# Patient Record
Sex: Male | Born: 1935 | Race: White | Hispanic: No | Marital: Married | Smoking: Never smoker
Health system: Southern US, Community
[De-identification: ages and names within clinical notes are randomized; demographics above are authoritative.]

## PROBLEM LIST (undated history)

## (undated) DIAGNOSIS — I10 Essential (primary) hypertension: Secondary | ICD-10-CM

## (undated) DIAGNOSIS — E785 Hyperlipidemia, unspecified: Secondary | ICD-10-CM

## (undated) DIAGNOSIS — K862 Cyst of pancreas: Secondary | ICD-10-CM

## (undated) HISTORY — PX: TONSILLECTOMY: SUR1361

## (undated) HISTORY — DX: Cyst of pancreas: K86.2

---

## 2004-06-23 ENCOUNTER — Ambulatory Visit: Payer: Self-pay

## 2014-11-02 ENCOUNTER — Other Ambulatory Visit: Payer: Self-pay | Admitting: Family Medicine

## 2014-12-09 ENCOUNTER — Other Ambulatory Visit: Payer: Self-pay | Admitting: Family Medicine

## 2015-03-04 ENCOUNTER — Encounter: Payer: Self-pay | Admitting: Emergency Medicine

## 2015-03-04 ENCOUNTER — Emergency Department
Admission: EM | Admit: 2015-03-04 | Discharge: 2015-03-04 | Disposition: A | Discharge status: Home / Self Care | Payer: Medicare Other | Attending: Emergency Medicine | Admitting: Emergency Medicine

## 2015-03-04 DIAGNOSIS — S0181XA Laceration without foreign body of other part of head, initial encounter: Secondary | ICD-10-CM | POA: Insufficient documentation

## 2015-03-04 DIAGNOSIS — Y998 Other external cause status: Secondary | ICD-10-CM | POA: Insufficient documentation

## 2015-03-04 DIAGNOSIS — Y9389 Activity, other specified: Secondary | ICD-10-CM | POA: Insufficient documentation

## 2015-03-04 DIAGNOSIS — W108XXA Fall (on) (from) other stairs and steps, initial encounter: Secondary | ICD-10-CM | POA: Diagnosis not present

## 2015-03-04 DIAGNOSIS — S60222A Contusion of left hand, initial encounter: Secondary | ICD-10-CM | POA: Insufficient documentation

## 2015-03-04 DIAGNOSIS — I10 Essential (primary) hypertension: Secondary | ICD-10-CM | POA: Diagnosis not present

## 2015-03-04 DIAGNOSIS — Y9289 Other specified places as the place of occurrence of the external cause: Secondary | ICD-10-CM | POA: Diagnosis not present

## 2015-03-04 DIAGNOSIS — S0990XA Unspecified injury of head, initial encounter: Secondary | ICD-10-CM | POA: Diagnosis present

## 2015-03-04 HISTORY — DX: Essential (primary) hypertension: I10

## 2015-03-04 HISTORY — DX: Hyperlipidemia, unspecified: E78.5

## 2015-03-04 MED ORDER — BACITRACIN ZINC 500 UNIT/GM EX OINT
TOPICAL_OINTMENT | Freq: Two times a day (BID) | CUTANEOUS | Status: DC
  Administered 2015-03-04: 1 via TOPICAL
  Filled 2015-03-04: qty 0.9

## 2015-03-04 MED ORDER — TRAMADOL HCL 50 MG PO TABS: 50.0000 mg | ORAL_TABLET | Freq: Four times a day (QID) | ORAL | Status: AC | PRN

## 2015-03-04 NOTE — Discharge Instructions (Signed)
Laceration Care, Adult  A laceration is a cut that goes through all layers of the skin. The cut also goes into the tissue that is right under the skin. Some cuts heal on their own. Others need to be closed with stitches (sutures), staples, skin adhesive strips, or wound glue. Taking care of your cut lowers your risk of infection and helps your cut to heal better.  HOW TO TAKE CARE OF YOUR CUT  For stitches or staples:  · Keep the wound clean and dry.  · If you were given a bandage (dressing), you should change it at least one time per day or as told by your doctor. You should also change it if it gets wet or dirty.  · Keep the wound completely dry for the first 24 hours or as told by your doctor. After that time, you may take a shower or a bath. However, make sure that the wound is not soaked in water until after the stitches or staples have been removed.  · Clean the wound one time each day or as told by your doctor:    Wash the wound with soap and water.    Rinse the wound with water until all of the soap comes off.    Pat the wound dry with a clean towel. Do not rub the wound.  · After you clean the wound, put a thin layer of antibiotic ointment on it as told by your doctor. This ointment:    Helps to prevent infection.    Keeps the bandage from sticking to the wound.  · Have your stitches or staples removed as told by your doctor.  If your doctor used skin adhesive strips:   · Keep the wound clean and dry.  · If you were given a bandage, you should change it at least one time per day or as told by your doctor. You should also change it if it gets dirty or wet.  · Do not get the skin adhesive strips wet. You can take a shower or a bath, but be careful to keep the wound dry.  · If the wound gets wet, pat it dry with a clean towel. Do not rub the wound.  · Skin adhesive strips fall off on their own. You can trim the strips as the wound heals. Do not remove any strips that are still stuck to the wound. They will  fall off after a while.  If your doctor used wound glue:  · Try to keep your wound dry, but you may briefly wet it in the shower or bath. Do not soak the wound in water, such as by swimming.  · After you take a shower or a bath, gently pat the wound dry with a clean towel. Do not rub the wound.  · Do not do any activities that will make you really sweaty until the skin glue has fallen off on its own.  · Do not apply liquid, cream, or ointment medicine to your wound while the skin glue is still on.  · If you were given a bandage, you should change it at least one time per day or as told by your doctor. You should also change it if it gets dirty or wet.  · If a bandage is placed over the wound, do not let the tape for the bandage touch the skin glue.  · Do not pick at the glue. The skin glue usually stays on for 5-10 days. Then, it   falls off of the skin.  General Instructions   · To help prevent scarring, make sure to cover your wound with sunscreen whenever you are outside after stitches are removed, after adhesive strips are removed, or when wound glue stays in place and the wound is healed. Make sure to wear a sunscreen of at least 30 SPF.  · Take over-the-counter and prescription medicines only as told by your doctor.  · If you were given antibiotic medicine or ointment, take or apply it as told by your doctor. Do not stop using the antibiotic even if your wound is getting better.  · Do not scratch or pick at the wound.  · Keep all follow-up visits as told by your doctor. This is important.  · Check your wound every day for signs of infection. Watch for:    Redness, swelling, or pain.    Fluid, blood, or pus.  · Raise (elevate) the injured area above the level of your heart while you are sitting or lying down, if possible.  GET HELP IF:  · You got a tetanus shot and you have any of these problems at the injection site:    Swelling.    Very bad pain.    Redness.    Bleeding.  · You have a fever.  · A wound that was  closed breaks open.  · You notice a bad smell coming from your wound or your bandage.  · You notice something coming out of the wound, such as wood or glass.  · Medicine does not help your pain.  · You have more redness, swelling, or pain at the site of your wound.  · You have fluid, blood, or pus coming from your wound.  · You notice a change in the color of your skin near your wound.  · You need to change the bandage often because fluid, blood, or pus is coming from the wound.  · You start to have a new rash.  · You start to have numbness around the wound.  GET HELP RIGHT AWAY IF:  · You have very bad swelling around the wound.  · Your pain suddenly gets worse and is very bad.  · You notice painful lumps near the wound or on skin that is anywhere on your body.  · You have a red streak going away from your wound.  · The wound is on your hand or foot and you cannot move a finger or toe like you usually can.  · The wound is on your hand or foot and you notice that your fingers or toes look pale or bluish.     This information is not intended to replace advice given to you by your health care provider. Make sure you discuss any questions you have with your health care provider.     Document Released: 06/16/2007 Document Revised: 05/14/2014 Document Reviewed: 12/24/2013  [employer]vier Interactive Patient Education ©2016 [employer]

## 2015-03-04 NOTE — ED Notes (Signed)
Pt to ed with c/o fall today,  States he tripped over a step, fell forward hitting forehead.  Pt denies loss of consciousness.  Pt with noted bleeding to forehead. Denies use of blood thinners.

## 2015-03-04 NOTE — ED Provider Notes (Signed)
[facility] Emergency Department Provider Note  ____________________________________________  Time seen: Approximately 2:00 PM  I have reviewed the triage vital signs and the nursing notes.   HISTORY  Chief Complaint Fall and Head Injury    HPI Shane Lindsey is a 80 y.o. male who presents with a head laceration. Around 12:30pm today the patient tripped on a cement step and hit a man standing in front of him then hit his head on cement. He has lacerations on his right forehead, right cheek and 3 small cuts on the left hand. The patient complains of localized aching around the lacerations. He ranks his pain a 2/10 severity. Denies radiation and associated symptoms, including nausea, vomiting, changes in vision or hearing, photophobia,  loss of consciousness, altered mental status, numbness, tingling, weakness and neck pain/stiffness.   Past Medical History  Diagnosis Date  . Hypertension   . Hyperlipemia     There are no active problems to display for this patient.   History reviewed. No pertinent past surgical history.  Current Outpatient Rx  Name  Route  Sig  Dispense  Refill  . traMADol (ULTRAM) 50 MG tablet   Oral   Take 1 tablet (50 mg total) by mouth every 6 (six) hours as needed for moderate pain.   12 tablet   0     Allergies Review of patient's allergies indicates no known allergies.  History reviewed. No pertinent family history.  Social History Social History  Substance Use Topics  . Smoking status: Never Smoker   . Smokeless tobacco: None  . Alcohol Use: Yes    Review of Systems Constitutional: No headache Eyes: No visual changes. ENT: No nose bleed. No changes in hearing. Tinnitus, unchanged.  Gastrointestinal: No nausea, no vomiting.  Musculoskeletal: Negative for neck pain. Skin: Laceration of forehead, cheek, left hand Neurological: Negative for headaches, focal weakness or numbness. 10-point ROS otherwise  negative.  ____________________________________________   PHYSICAL EXAM:  VITAL SIGNS: ED Triage Vitals  Enc Vitals Group     BP 03/04/15 1323 145/98 mmHg     Pulse Rate 03/04/15 1323 96     Resp 03/04/15 1323 20     Temp 03/04/15 1323 98.2 F (36.8 C)     Temp Source 03/04/15 1323 Oral     SpO2 03/04/15 1323 100 %     Weight 03/04/15 1323 187 lb (84.823 kg)     Height 03/04/15 1323  (1.803 m)     Head Cir --      Peak Flow --      Pain Score 03/04/15 1324 2     Pain Loc --      Pain Edu? --      Excl. in GC? --     Constitutional: Alert and oriented. Well appearing and in no acute distress. Head: 2.5 cm laceration of right forehead and 0.5 cm laceration of right cheek Cardiovascular: Normal rate, regular rhythm. Grossly normal heart sounds.   Respiratory: Normal respiratory effort.  No retractions. Lungs CTAB. Musculoskeletal: Full cervical ROM and hand ROM Neurologic: CN II-XII intact. Normal speech and language. No gross focal neurologic deficits are appreciated. No gait instability. Skin:  3 minor lacerations and ecchymosis of left hand.  Psychiatric: Mood and affect are normal. Speech and behavior are normal.  ____________________________________________   LABS (all labs ordered are listed, but only abnormal results are displayed)  Labs Reviewed - No data to display ____________________________________________  EKG   ____________________________________________  RADIOLOGY  ____________________________________________   PROCEDURES  Procedure(s) performed: None  Critical Care performed: No  _________________see procedure note LACERATION REPAIR Performed by: Randa Lynn PA-S Authorized by: Joni Reining Consent: Verbal consent obtained. Risks and benefits: risks, benefits and alternatives were discussed Consent given by: patient Patient identity confirmed: provided demographic data Prepped and Draped in normal sterile fashion Wound  explored  Laceration Location: Right forehead   Laceration Length: 2.5 cm  No Foreign Bodies seen or palpated  Anesthesia: local infiltration  Local anesthetic: lidocaine 1% with epinephrine  Anesthetic total: 3 ml  Irrigation method: syringe Amount of cleaning: standard  Skin closure: 4-0 prolene and 6-0 prolene   Number of sutures: 8  Technique: Simple interrupted   Patient tolerance: Patient tolerated the procedure well with no immediate complications. ___________________________   INITIAL IMPRESSION / ASSESSMENT AND PLAN / ED COURSE  Pertinent labs & imaging results that were available during my care of the patient were reviewed by me and considered in my medical decision making (see chart for details).  Head laceration. Patient get advised on wound care. Patient advised return back in 5 days for suture removal or sooner wound reopens. Patient given a prescription for tramadol to take as needed for pain. ____________________________________________   FINAL CLINICAL IMPRESSION(S) / ED DIAGNOSES  Final diagnoses:  Forehead laceration, initial encounter      Joni Reining, PA-C 03/04/15 1450  Richardean Canal, MD 03/05/15 1130

## 2015-03-10 ENCOUNTER — Emergency Department
Admission: EM | Admit: 2015-03-10 | Discharge: 2015-03-10 | Disposition: A | Discharge status: Home / Self Care | Payer: Medicare Other | Attending: Emergency Medicine | Admitting: Emergency Medicine

## 2015-03-10 ENCOUNTER — Encounter: Payer: Self-pay | Admitting: Emergency Medicine

## 2015-03-10 DIAGNOSIS — I10 Essential (primary) hypertension: Secondary | ICD-10-CM | POA: Insufficient documentation

## 2015-03-10 DIAGNOSIS — Z4801 Encounter for change or removal of surgical wound dressing: Secondary | ICD-10-CM | POA: Diagnosis not present

## 2015-03-10 DIAGNOSIS — Z4802 Encounter for removal of sutures: Secondary | ICD-10-CM

## 2015-03-10 NOTE — ED Notes (Signed)
Needs sutures removed from forehead.  

## 2015-03-10 NOTE — Discharge Instructions (Signed)

## 2015-03-10 NOTE — ED Provider Notes (Signed)
[facility] Emergency Department Provider Note  ____________________________________________  Time seen: Approximately 2:26 PM  I have reviewed the triage vital signs and the nursing notes.   HISTORY  Chief Complaint Suture / Staple Removal    HPI Shane Lindsey is a 80 y.o. male who presents emergency department for suture removal. Patient was seen on 03/04/2015 in this department for laceration to the forehead. 8 sutures were placed at that time. Patient returns to the emergency department for suture removal. Patient denies any complications from same. He denies any erythema or edema to area.   Past Medical History  Diagnosis Date  . Hypertension   . Hyperlipemia     There are no active problems to display for this patient.   History reviewed. No pertinent past surgical history.  Current Outpatient Rx  Name  Route  Sig  Dispense  Refill  . traMADol (ULTRAM) 50 MG tablet   Oral   Take 1 tablet (50 mg total) by mouth every 6 (six) hours as needed for moderate pain.   12 tablet   0     Allergies Review of patient's allergies indicates no known allergies.  History reviewed. No pertinent family history.  Social History Social History  Substance Use Topics  . Smoking status: Never Smoker   . Smokeless tobacco: None  . Alcohol Use: Yes     Review of Systems  Constitutional: No fever/chills Skin: Negative for rash. Positive for laceration to the right side of forehead. Neurological: Negative for headaches, focal weakness or numbness. 10-point ROS otherwise negative.  ____________________________________________   PHYSICAL EXAM:  VITAL SIGNS: ED Triage Vitals  Enc Vitals Group     BP 03/10/15 1353 141/84 mmHg     Pulse Rate 03/10/15 1353 106     Resp 03/10/15 1353 18     Temp 03/10/15 1353 97.7 F (36.5 C)     Temp src --      SpO2 03/10/15 1353 99 %     Weight 03/10/15 1353 181 lb (82.101 kg)     Height 03/10/15 1353 5'  11" (1.803 m)     Head Cir --      Peak Flow --      Pain Score 03/10/15 1352 0     Pain Loc --      Pain Edu? --      Excl. in GC? --      Constitutional: Alert and oriented. Well appearing and in no acute distress. Eyes: Conjunctivae are normal. PERRL. EOMI. Head: Atraumatic. Cardiovascular: Normal rate, regular rhythm. Normal S1 and S2.  Good peripheral circulation. Respiratory: Normal respiratory effort without tachypnea or retractions. Lungs CTAB. Neurologic:  Normal speech and language. No gross focal neurologic deficits are appreciated.  Skin:  Skin is warm, dry and intact. No rash noted. Laceration to the right forehead noted. Laceration is approximately 3 cm in length. 8 sutures are placed. No erythema or edema. No drainage noted. No dehiscence. Psychiatric: Mood and affect are normal. Speech and behavior are normal. Patient exhibits appropriate insight and judgement.   ____________________________________________   LABS (all labs ordered are listed, but only abnormal results are displayed)  Labs Reviewed - No data to display ____________________________________________  EKG   ____________________________________________  RADIOLOGY   No results found.  ____________________________________________    PROCEDURES  Procedure(s) performed:     SUTURE REMOVAL Performed by: Racheal Patches  Consent: Verbal consent obtained.  Location details: Right forehead  Wound Appearance: clean  Sutures/Staples Removed: 8   Facility: sutures placed in this facility Patient tolerance: Patient tolerated the procedure well with no immediate complications.     Medications - No data to display   ____________________________________________   INITIAL IMPRESSION / ASSESSMENT AND PLAN / ED COURSE  Pertinent labs & imaging results that were available during my care of the patient were reviewed by me and considered in my medical decision making (see chart for  details).  Patient's diagnosis is consistent with suture removal. 8 sutures are removed. No dehiscence of the wound.. Patient tolerated procedure well.     ____________________________________________  FINAL CLINICAL IMPRESSION(S) / ED DIAGNOSES  Final diagnoses:  Visit for suture removal      NEW MEDICATIONS STARTED DURING THIS VISIT:  Discharge Medication List as of 03/10/2015  2:27 PM          Delorise Royals Cuthriell, PA-C 03/10/15 1443  Sharman Cheek, MD 03/10/15 1524

## 2018-05-11 ENCOUNTER — Other Ambulatory Visit: Payer: Self-pay | Admitting: Urology

## 2018-05-11 DIAGNOSIS — R972 Elevated prostate specific antigen [PSA]: Secondary | ICD-10-CM

## 2018-05-24 ENCOUNTER — Other Ambulatory Visit: Payer: Self-pay

## 2018-05-24 ENCOUNTER — Ambulatory Visit
Admission: RE | Admit: 2018-05-24 | Discharge: 2018-05-24 | Disposition: A | Discharge status: Home / Self Care | Payer: Medicare Other | Source: Ambulatory Visit | Attending: Urology | Admitting: Urology

## 2018-05-24 DIAGNOSIS — R972 Elevated prostate specific antigen [PSA]: Secondary | ICD-10-CM | POA: Diagnosis present

## 2018-05-24 LAB — POCT I-STAT CREATININE: Creatinine, Ser: 1 mg/dL (ref 0.61–1.24)

## 2018-05-24 MED ORDER — GADOBUTROL 1 MMOL/ML IV SOLN
8.0000 mL | Freq: Once | INTRAVENOUS | Status: AC | PRN
  Administered 2018-05-24: 10:00:00 8 mL via INTRAVENOUS

## 2018-07-21 ENCOUNTER — Other Ambulatory Visit: Payer: Self-pay | Admitting: Internal Medicine

## 2018-07-21 DIAGNOSIS — R634 Abnormal weight loss: Secondary | ICD-10-CM

## 2018-07-21 DIAGNOSIS — R7989 Other specified abnormal findings of blood chemistry: Secondary | ICD-10-CM

## 2018-07-21 DIAGNOSIS — R17 Unspecified jaundice: Secondary | ICD-10-CM

## 2018-07-26 ENCOUNTER — Other Ambulatory Visit: Payer: Self-pay

## 2018-07-26 ENCOUNTER — Ambulatory Visit
Admission: RE | Admit: 2018-07-26 | Discharge: 2018-07-26 | Disposition: A | Discharge status: Home / Self Care | Payer: Medicare Other | Source: Ambulatory Visit | Attending: Internal Medicine | Admitting: Internal Medicine

## 2018-07-26 DIAGNOSIS — R17 Unspecified jaundice: Secondary | ICD-10-CM

## 2018-07-26 DIAGNOSIS — R634 Abnormal weight loss: Secondary | ICD-10-CM

## 2018-07-26 DIAGNOSIS — R7989 Other specified abnormal findings of blood chemistry: Secondary | ICD-10-CM | POA: Diagnosis present

## 2018-08-04 ENCOUNTER — Inpatient Hospital Stay: Admission: RE | Admit: 2018-08-04 | Payer: Medicare Other | Source: Ambulatory Visit

## 2018-08-08 ENCOUNTER — Ambulatory Visit: Admission: RE | Admit: 2018-08-08 | Payer: Medicare Other | Source: Home / Self Care | Admitting: Urology

## 2018-08-08 ENCOUNTER — Encounter: Admission: RE | Payer: Self-pay | Source: Home / Self Care

## 2018-08-08 SURGERY — CYSTOSCOPY, WITH BLADDER CALCULUS LITHOLAPAXY
Anesthesia: Choice

## 2018-08-10 ENCOUNTER — Emergency Department: Payer: Medicare Other

## 2018-08-10 ENCOUNTER — Other Ambulatory Visit: Payer: Self-pay

## 2018-08-10 ENCOUNTER — Emergency Department
Admission: EM | Admit: 2018-08-10 | Discharge: 2018-08-11 | Disposition: A | Discharge status: Home / Self Care | Payer: Medicare Other | Attending: Emergency Medicine | Admitting: Emergency Medicine

## 2018-08-10 DIAGNOSIS — Z79899 Other long term (current) drug therapy: Secondary | ICD-10-CM | POA: Diagnosis not present

## 2018-08-10 DIAGNOSIS — E876 Hypokalemia: Secondary | ICD-10-CM

## 2018-08-10 DIAGNOSIS — W010XXA Fall on same level from slipping, tripping and stumbling without subsequent striking against object, initial encounter: Secondary | ICD-10-CM | POA: Insufficient documentation

## 2018-08-10 DIAGNOSIS — F10920 Alcohol use, unspecified with intoxication, uncomplicated: Secondary | ICD-10-CM | POA: Insufficient documentation

## 2018-08-10 DIAGNOSIS — R4182 Altered mental status, unspecified: Secondary | ICD-10-CM | POA: Diagnosis present

## 2018-08-10 DIAGNOSIS — I10 Essential (primary) hypertension: Secondary | ICD-10-CM | POA: Diagnosis not present

## 2018-08-10 DIAGNOSIS — W19XXXA Unspecified fall, initial encounter: Secondary | ICD-10-CM

## 2018-08-10 LAB — URINALYSIS, ROUTINE W REFLEX MICROSCOPIC
Bilirubin Urine: NEGATIVE
Glucose, UA: NEGATIVE mg/dL
Hgb urine dipstick: NEGATIVE
Ketones, ur: NEGATIVE mg/dL
Leukocytes,Ua: NEGATIVE
Nitrite: NEGATIVE
Protein, ur: NEGATIVE mg/dL
Specific Gravity, Urine: 1.017 (ref 1.005–1.030)
pH: 5 (ref 5.0–8.0)

## 2018-08-10 LAB — CBC WITH DIFFERENTIAL/PLATELET
Abs Immature Granulocytes: 0.03 10*3/uL (ref 0.00–0.07)
Basophils Absolute: 0.1 10*3/uL (ref 0.0–0.1)
Basophils Relative: 1 %
Eosinophils Absolute: 0.4 10*3/uL (ref 0.0–0.5)
Eosinophils Relative: 4 %
HCT: 31.9 % — ABNORMAL LOW (ref 39.0–52.0)
Hemoglobin: 10.9 g/dL — ABNORMAL LOW (ref 13.0–17.0)
Immature Granulocytes: 0 %
Lymphocytes Relative: 37 %
Lymphs Abs: 4.1 10*3/uL — ABNORMAL HIGH (ref 0.7–4.0)
MCH: 37.3 pg — ABNORMAL HIGH (ref 26.0–34.0)
MCHC: 34.2 g/dL (ref 30.0–36.0)
MCV: 109.2 fL — ABNORMAL HIGH (ref 80.0–100.0)
Monocytes Absolute: 1.2 10*3/uL — ABNORMAL HIGH (ref 0.1–1.0)
Monocytes Relative: 11 %
Neutro Abs: 5.1 10*3/uL (ref 1.7–7.7)
Neutrophils Relative %: 47 %
Platelets: 300 10*3/uL (ref 150–400)
RBC: 2.92 MIL/uL — ABNORMAL LOW (ref 4.22–5.81)
RDW: 15.4 % (ref 11.5–15.5)
WBC: 11 10*3/uL — ABNORMAL HIGH (ref 4.0–10.5)
nRBC: 0 % (ref 0.0–0.2)

## 2018-08-10 LAB — TROPONIN I (HIGH SENSITIVITY): Troponin I (High Sensitivity): 7 ng/L (ref <18)

## 2018-08-10 LAB — COMPREHENSIVE METABOLIC PANEL
ALT: 20 U/L (ref 0–44)
AST: 52 U/L — ABNORMAL HIGH (ref 15–41)
Albumin: 2.5 g/dL — ABNORMAL LOW (ref 3.5–5.0)
Alkaline Phosphatase: 225 U/L — ABNORMAL HIGH (ref 38–126)
Anion gap: 9 (ref 5–15)
BUN: 12 mg/dL (ref 8–23)
CO2: 26 mmol/L (ref 22–32)
Calcium: 7.7 mg/dL — ABNORMAL LOW (ref 8.9–10.3)
Chloride: 101 mmol/L (ref 98–111)
Creatinine, Ser: 1.11 mg/dL (ref 0.61–1.24)
GFR calc Af Amer: >60 mL/min (ref >60)
GFR calc non Af Amer: >60 mL/min (ref >60)
Glucose, Bld: 139 mg/dL — ABNORMAL HIGH (ref 70–99)
Potassium: 2.7 mmol/L — CL (ref 3.5–5.1)
Sodium: 136 mmol/L (ref 135–145)
Total Bilirubin: 0.9 mg/dL (ref 0.3–1.2)
Total Protein: 5.7 g/dL — ABNORMAL LOW (ref 6.5–8.1)

## 2018-08-10 LAB — MAGNESIUM: Magnesium: 1.8 mg/dL (ref 1.7–2.4)

## 2018-08-10 LAB — CK: Total CK: 52 U/L (ref 49–397)

## 2018-08-10 LAB — LIPASE, BLOOD: Lipase: 16 U/L (ref 11–51)

## 2018-08-10 MED ORDER — POTASSIUM CHLORIDE CRYS ER 20 MEQ PO TBCR
40.0000 meq | EXTENDED_RELEASE_TABLET | Freq: Once | ORAL | Status: AC
  Administered 2018-08-11: 40 meq via ORAL
  Filled 2018-08-10: qty 2

## 2018-08-10 MED ORDER — POTASSIUM CHLORIDE 10 MEQ/100ML IV SOLN
10.0000 meq | INTRAVENOUS | Status: DC
  Administered 2018-08-11: 10 meq via INTRAVENOUS
  Filled 2018-08-10 (×2): qty 100

## 2018-08-10 NOTE — ED Provider Notes (Signed)
[facility] Emergency Department Provider Note  ____________________________________________   None    (approximate)  I have reviewed the triage vital signs and the nursing notes.   HISTORY  Chief Complaint Fall and Altered Mental Status    HPI Shane Lindsey is a 83 y.o. male with hypertension hyperlipidemia who presents with fall and altered mental status.  Patient presents with son.  Patient has been drinking alcohol today.  He says that he drinks daily.  He had a fall unclear exactly what time.  It was mechanical in nature in which he slipped and fell.  He lives with his wife who is not able to help him up so EMS was called.  Patient was then transported.  According the son he is been acting different over the past 3 weeks.  He is concerned that he might have cancer that he is trying to not tell the family about.  Patient himself denies losing consciousness and denies any extremity pain.  Denies any abdominal pain or chest pain.   Fall occurred one time today, it occurred due to him tripping, denies any current pain.    Past Medical History:  Diagnosis Date   Hyperlipemia    Hypertension     There are no active problems to display for this patient.   Past Surgical History:  Procedure Laterality Date   TONSILLECTOMY      Prior to Admission medications   Medication Sig Start Date End Date Taking? Authorizing Provider  lisinopril (ZESTRIL) 30 MG tablet Take 30 mg by mouth daily.   Yes [provider]  Polyethyl Glycol-Propyl Glycol (SYSTANE OP) Place 1 drop into both eyes daily as needed (dry eyes).   Yes [provider]  tamsulosin (FLOMAX) 0.4 MG CAPS capsule Take 0.4 mg by mouth daily.   Yes [provider]  traMADol (ULTRAM) 50 MG tablet Take 1 tablet (50 mg total) by mouth every 6 (six) hours as needed for moderate pain. Patient not taking: Reported on 07/31/2018 03/04/15   Joni ReiningSmith, Ronald K, PA-C     Allergies Patient has no known allergies.  No family history on file.  Social History Social History   Tobacco Use   Smoking status: Never Smoker   Smokeless tobacco: Never Used  Substance Use Topics   Alcohol use: Yes   Drug use: No      Review of Systems Constitutional: No fever/chills, positive fall Eyes: No visual changes. ENT: No sore throat. Cardiovascular: Denies chest pain. Respiratory: Denies shortness of breath. Gastrointestinal: No abdominal pain.  No nausea, no vomiting.  No diarrhea.  No constipation. Genitourinary: Negative for dysuria. Musculoskeletal: Negative for back pain. Skin: Negative for rash. Neurological: Negative for headaches, focal weakness or numbness. All other ROS negative ____________________________________________   PHYSICAL EXAM:  VITAL SIGNS: ED Triage Vitals  Enc Vitals Group     BP 08/10/18 2211 (!) 120/45     Pulse Rate 08/10/18 2211 88     Resp 08/10/18 2211 11     Temp 08/10/18 2211 98.2 F (36.8 C)     Temp src --      SpO2 08/10/18 2211 100 %     Weight 08/10/18 2212 157 lb (71.2 kg)     Height 08/10/18 2212 5\' 11"  (1.803 m)     Head Circumference --      Peak Flow --      Pain Score 08/10/18 2212 0     Pain Loc --  Pain Edu? --      Excl. in GC? --     Constitutional: Alert and oriented. GCS 15  Eyes: Conjunctivae are normal. EOMI. Head: Atraumatic. Nose: No congestion/rhinnorhea. Mouth/Throat: Mucous membranes are moist.   Neck: No stridor. Trachea Midline. FROM Cardiovascular: Normal rate, regular rhythm. Grossly normal heart sounds.  Good peripheral circulation. No chest wall tenderness Respiratory: Normal respiratory effort.  No retractions. Lungs CTAB. Gastrointestinal: Soft and nontender. No distention. No abdominal bruits.  Musculoskeletal:   RUE: No point tenderness, deformity or other signs of injury. Radial pulse intact. Neuro intact. Full ROM in joint. LUE: No point tenderness,  deformity or other signs of injury. Radial pulse intact. Neuro intact. Full ROM in joints RLE: No point tenderness, deformity or other signs of injury. DP pulse intact. Neuro intact. Full ROM in joints. LLE: No point tenderness, deformity or other signs of injury. DP pulse intact. Neuro intact. Full ROM in joints. Neurologic:  Normal speech and language. No gross focal neurologic deficits are appreciated.  Skin:  Skin is warm, dry and intact. No rash noted. Psychiatric: Mood and affect are normal.  Fast speech GU: Deferred   ____________________________________________   LABS (all labs ordered are listed, but only abnormal results are displayed)  Labs Reviewed  CBC WITH DIFFERENTIAL/PLATELET - Abnormal; Notable for the following components:      Result Value   WBC 11.0 (*)    RBC 2.92 (*)    Hemoglobin 10.9 (*)    HCT 31.9 (*)    MCV 109.2 (*)    MCH 37.3 (*)    Lymphs Abs 4.1 (*)    Monocytes Absolute 1.2 (*)    All other components within normal limits  COMPREHENSIVE METABOLIC PANEL  LIPASE, BLOOD  URINALYSIS, ROUTINE W REFLEX MICROSCOPIC  CK  MAGNESIUM  TROPONIN I (HIGH SENSITIVITY)   ____________________________________________   ED ECG REPORT I, Concha SeMary E Zackerie Sara, the attending physician, personally viewed and interpreted this ECG.  EKG is sinus rate of 77, no ST elevation, no T wave inversion, normal intervals ____________________________________________  RADIOLOGY Vela ProseI, Angeliz Settlemyre E Rodolfo Gaster, personally viewed and evaluated these images (plain radiographs) as part of my medical decision making, as well as reviewing the written report by the radiologist.  ED MD interpretation: Negative x-rays  Official radiology report(s): Dg Pelvis 1-2 Views  Result Date: 08/10/2018 CLINICAL DATA:  Fall EXAM: PELVIS - 1-2 VIEW COMPARISON:  None. FINDINGS: Pubic symphysis and rami are intact. The SI joints are non widened. Both femoral heads project in joint. No acute displaced fracture or  malalignment IMPRESSION: No acute osseous abnormality Electronically Signed   By: Jasmine PangKim  Fujinaga M.D.   On: 08/10/2018 23:26   Ct Head Wo Contrast  Result Date: 08/10/2018 CLINICAL DATA:  Fall EXAM: CT HEAD WITHOUT CONTRAST CT CERVICAL SPINE WITHOUT CONTRAST TECHNIQUE: Multidetector CT imaging of the head and cervical spine was performed following the standard protocol without intravenous contrast. Multiplanar CT image reconstructions of the cervical spine were also generated. COMPARISON:  None. FINDINGS: CT HEAD FINDINGS Brain: No acute territorial infarction, hemorrhage or intracranial mass. Moderate atrophy. Mild small vessel ischemic changes of the white matter. Slightly prominent ventricles, felt secondary to atrophy Vascular: No hyperdense vessels.  Carotid vascular calcification Skull: Normal. Negative for fracture or focal lesion. Sinuses/Orbits: Mucosal thickening in the right maxillary and ethmoid sinuses Other: None CT CERVICAL SPINE FINDINGS Alignment: Straightening of the cervical spine. Trace retrolisthesis C4 on C5. Facet alignment normal. Skull base and vertebrae: No acute  fracture. No primary bone lesion or focal pathologic process. Soft tissues and spinal canal: No prevertebral fluid or swelling. No visible canal hematoma. Disc levels:  Moderate degenerative change C5-C6 and C6-C7 Upper chest: Negative. Other: None IMPRESSION: 1. No CT evidence for acute intracranial abnormality. 2. Atrophy and small vessel ischemic changes of the white matter 3. Straightening of the cervical spine. No acute osseous abnormality. Electronically Signed   By: Donavan Foil M.D.   On: 08/10/2018 23:31   Ct Cervical Spine Wo Contrast  Result Date: 08/10/2018 CLINICAL DATA:  Fall EXAM: CT HEAD WITHOUT CONTRAST CT CERVICAL SPINE WITHOUT CONTRAST TECHNIQUE: Multidetector CT imaging of the head and cervical spine was performed following the standard protocol without intravenous contrast. Multiplanar CT image  reconstructions of the cervical spine were also generated. COMPARISON:  None. FINDINGS: CT HEAD FINDINGS Brain: No acute territorial infarction, hemorrhage or intracranial mass. Moderate atrophy. Mild small vessel ischemic changes of the white matter. Slightly prominent ventricles, felt secondary to atrophy Vascular: No hyperdense vessels.  Carotid vascular calcification Skull: Normal. Negative for fracture or focal lesion. Sinuses/Orbits: Mucosal thickening in the right maxillary and ethmoid sinuses Other: None CT CERVICAL SPINE FINDINGS Alignment: Straightening of the cervical spine. Trace retrolisthesis C4 on C5. Facet alignment normal. Skull base and vertebrae: No acute fracture. No primary bone lesion or focal pathologic process. Soft tissues and spinal canal: No prevertebral fluid or swelling. No visible canal hematoma. Disc levels:  Moderate degenerative change C5-C6 and C6-C7 Upper chest: Negative. Other: None IMPRESSION: 1. No CT evidence for acute intracranial abnormality. 2. Atrophy and small vessel ischemic changes of the white matter 3. Straightening of the cervical spine. No acute osseous abnormality. Electronically Signed   By: Donavan Foil M.D.   On: 08/10/2018 23:31   Dg Chest Portable 1 View  Result Date: 08/10/2018 CLINICAL DATA:  Fall EXAM: PORTABLE CHEST 1 VIEW COMPARISON:  None. FINDINGS: No focal airspace disease or effusion. Diffuse bilateral interstitial opacity. No pneumothorax. Borderline to mild cardiomegaly IMPRESSION: 1. Diffuse mildly coarse interstitial opacity of uncertain chronicity. Findings could be due to chronic interstitial disease although acute interstitial inflammatory process or possible atypical infection could be considered Electronically Signed   By: Donavan Foil M.D.   On: 08/10/2018 23:25    ____________________________________________   PROCEDURES  Procedure(s) performed (including Critical  Care):  Procedures   ____________________________________________   INITIAL IMPRESSION / ASSESSMENT AND PLAN / ED COURSE      Shane Lindsey was evaluated in Emergency Department on 08/10/2018 for the symptoms described in the history of present illness. He was evaluated in the context of the global COVID-19 pandemic, which necessitated consideration that the patient might be at risk for infection with the SARS-CoV-2 virus that causes COVID-19. Institutional protocols and algorithms that pertain to the evaluation of patients at risk for COVID-19 are in a state of rapid change based on information released by regulatory bodies including the CDC and federal and state organizations. These policies and algorithms were followed during the patient's care in the ED.    Patient presents with EtOH use and fall.  Will get CT head CT cervical to evaluate for epidural subdural hematoma.  Patient does not have any extremity pain to suggest extremity fracture.  Will get basic labs to evaluate for electrolyte abnormalities, CK to evaluate for rhabdo and UA to evaluate for UTI.  I extensive conversation with patient's son that if he has been diagnosed with cancer does not wish to share with  his family that is not my ability to discuss that with him due to hippa.  I explained that would have to proceed with the work-up for the event today and then go from there.    Patient handed off to incoming team pending labs and CT imaging  ____________________________________________   FINAL CLINICAL IMPRESSION(S) / ED DIAGNOSES   Final diagnoses:  Fall, initial encounter      MEDICATIONS GIVEN DURING THIS VISIT:  Medications  potassium chloride 10 mEq in 100 mL IVPB (has no administration in time range)  potassium chloride SA (K-DUR) CR tablet 40 mEq (has no administration in time range)     ED Discharge Orders    None       Note:  This document was prepared using Dragon voice recognition  software and may include unintentional dictation errors.   Concha SeFunke, Coretta Leisey E, MD 08/10/18 [phone]

## 2018-08-10 NOTE — ED Triage Notes (Signed)
PT to ED via EMS from home. Pts wife called out for a fall that happened this afternoon supposedly. PT fell from standing and hit lip on trash can. Unknown when this happened, unknown of loss of consciousness. PT has ETOH on board, unclear how much. PT is keenly alert and responsive as far as orientation questions go, but it is unclear as to why he's here.

## 2018-08-11 DIAGNOSIS — R4182 Altered mental status, unspecified: Secondary | ICD-10-CM | POA: Diagnosis not present

## 2018-08-11 LAB — URINE DRUG SCREEN, QUALITATIVE (ARMC ONLY)
Amphetamines, Ur Screen: NOT DETECTED
Barbiturates, Ur Screen: NOT DETECTED
Benzodiazepine, Ur Scrn: NOT DETECTED
Cannabinoid 50 Ng, Ur ~~LOC~~: NOT DETECTED
Cocaine Metabolite,Ur ~~LOC~~: NOT DETECTED
MDMA (Ecstasy)Ur Screen: NOT DETECTED
Methadone Scn, Ur: NOT DETECTED
Opiate, Ur Screen: NOT DETECTED
Phencyclidine (PCP) Ur S: NOT DETECTED
Tricyclic, Ur Screen: NOT DETECTED

## 2018-08-11 LAB — ETHANOL: Alcohol, Ethyl (B): 170 mg/dL — ABNORMAL HIGH (ref <10)

## 2018-08-11 MED ORDER — POTASSIUM CHLORIDE CRYS ER 20 MEQ PO TBCR
20.0000 meq | EXTENDED_RELEASE_TABLET | Freq: Once | ORAL | Status: AC
  Administered 2018-08-11: 20 meq via ORAL
  Filled 2018-08-11: qty 1

## 2018-08-11 NOTE — Discharge Instructions (Addendum)
Drink plenty of fluids daily.  Eat bananas or green leafy vegetables to supplement your potassium.  Return to the ER for worsening symptoms, persistent vomiting, lethargy, difficulty breathing or other concerns.

## 2018-08-11 NOTE — ED Provider Notes (Signed)
-----------------------------------------   2:43 AM on 08/11/2018 -----------------------------------------  Patient resting in no acute distress.  Eager for discharge home.  Voices no complaints.  No nausea/vomiting.  Will change second IV potassium to 20 mEq orally.  Patient to follow-up closely with his PCP.  Strict return precautions given.  Patient verbalizes understanding agrees with plan of care.  Son at bedside.   Paulette Blanch, MD 08/11/18 [phone]

## 2018-08-11 NOTE — ED Notes (Signed)
Previous IV started upon pt arrival infiltrated. Approx 16mL of K+ infused through infiltrated IV before realization. IV d/ced and new IV started. PT given warm compress to old IV sight with slightly more than normal redness noted.

## 2018-09-16 ENCOUNTER — Inpatient Hospital Stay
Admission: EM | Admit: 2018-09-16 | Discharge: 2018-09-19 | DRG: 683 | Disposition: A | Discharge status: Home Health | Payer: Medicare Other | Attending: Internal Medicine | Admitting: Internal Medicine

## 2018-09-16 ENCOUNTER — Observation Stay: Payer: Medicare Other

## 2018-09-16 ENCOUNTER — Emergency Department: Payer: Medicare Other

## 2018-09-16 ENCOUNTER — Encounter: Payer: Self-pay | Admitting: Emergency Medicine

## 2018-09-16 ENCOUNTER — Other Ambulatory Visit: Payer: Self-pay

## 2018-09-16 DIAGNOSIS — N179 Acute kidney failure, unspecified: Secondary | ICD-10-CM | POA: Diagnosis not present

## 2018-09-16 DIAGNOSIS — R531 Weakness: Secondary | ICD-10-CM | POA: Diagnosis not present

## 2018-09-16 DIAGNOSIS — I10 Essential (primary) hypertension: Secondary | ICD-10-CM | POA: Diagnosis present

## 2018-09-16 DIAGNOSIS — Z79899 Other long term (current) drug therapy: Secondary | ICD-10-CM

## 2018-09-16 DIAGNOSIS — Z20828 Contact with and (suspected) exposure to other viral communicable diseases: Secondary | ICD-10-CM | POA: Diagnosis present

## 2018-09-16 DIAGNOSIS — E876 Hypokalemia: Secondary | ICD-10-CM | POA: Diagnosis present

## 2018-09-16 DIAGNOSIS — R778 Other specified abnormalities of plasma proteins: Secondary | ICD-10-CM

## 2018-09-16 DIAGNOSIS — K862 Cyst of pancreas: Secondary | ICD-10-CM | POA: Diagnosis present

## 2018-09-16 DIAGNOSIS — T502X5A Adverse effect of carbonic-anhydrase inhibitors, benzothiadiazides and other diuretics, initial encounter: Secondary | ICD-10-CM | POA: Diagnosis present

## 2018-09-16 DIAGNOSIS — I110 Hypertensive heart disease with heart failure: Secondary | ICD-10-CM | POA: Diagnosis present

## 2018-09-16 DIAGNOSIS — E785 Hyperlipidemia, unspecified: Secondary | ICD-10-CM | POA: Diagnosis present

## 2018-09-16 DIAGNOSIS — I509 Heart failure, unspecified: Secondary | ICD-10-CM

## 2018-09-16 LAB — BASIC METABOLIC PANEL
Anion gap: 12 (ref 5–15)
BUN: 25 mg/dL — ABNORMAL HIGH (ref 8–23)
CO2: 27 mmol/L (ref 22–32)
Calcium: 8 mg/dL — ABNORMAL LOW (ref 8.9–10.3)
Chloride: 97 mmol/L — ABNORMAL LOW (ref 98–111)
Creatinine, Ser: 1.63 mg/dL — ABNORMAL HIGH (ref 0.61–1.24)
GFR calc Af Amer: 45 mL/min — ABNORMAL LOW (ref >60)
GFR calc non Af Amer: 39 mL/min — ABNORMAL LOW (ref >60)
Glucose, Bld: 201 mg/dL — ABNORMAL HIGH (ref 70–99)
Potassium: 3 mmol/L — ABNORMAL LOW (ref 3.5–5.1)
Sodium: 136 mmol/L (ref 135–145)

## 2018-09-16 LAB — URINALYSIS, COMPLETE (UACMP) WITH MICROSCOPIC
Bacteria, UA: NONE SEEN
Bilirubin Urine: NEGATIVE
Glucose, UA: NEGATIVE mg/dL
Hgb urine dipstick: NEGATIVE
Ketones, ur: NEGATIVE mg/dL
Leukocytes,Ua: NEGATIVE
Nitrite: NEGATIVE
Protein, ur: NEGATIVE mg/dL
Specific Gravity, Urine: 1.016 (ref 1.005–1.030)
pH: 5 (ref 5.0–8.0)

## 2018-09-16 LAB — MAGNESIUM: Magnesium: 1.6 mg/dL — ABNORMAL LOW (ref 1.7–2.4)

## 2018-09-16 LAB — CBC
HCT: 31.2 % — ABNORMAL LOW (ref 39.0–52.0)
Hemoglobin: 11.3 g/dL — ABNORMAL LOW (ref 13.0–17.0)
MCH: 38 pg — ABNORMAL HIGH (ref 26.0–34.0)
MCHC: 36.2 g/dL — ABNORMAL HIGH (ref 30.0–36.0)
MCV: 105.1 fL — ABNORMAL HIGH (ref 80.0–100.0)
Platelets: 144 10*3/uL — ABNORMAL LOW (ref 150–400)
RBC: 2.97 MIL/uL — ABNORMAL LOW (ref 4.22–5.81)
RDW: 14.1 % (ref 11.5–15.5)
WBC: 11.2 10*3/uL — ABNORMAL HIGH (ref 4.0–10.5)
nRBC: 0 % (ref 0.0–0.2)

## 2018-09-16 LAB — TROPONIN I (HIGH SENSITIVITY): Troponin I (High Sensitivity): 20 ng/L — ABNORMAL HIGH (ref <18)

## 2018-09-16 LAB — BRAIN NATRIURETIC PEPTIDE: B Natriuretic Peptide: 226 pg/mL — ABNORMAL HIGH (ref 0.0–100.0)

## 2018-09-16 MED ORDER — ONDANSETRON HCL 4 MG PO TABS: 4.0000 mg | ORAL_TABLET | Freq: Four times a day (QID) | ORAL | Status: DC | PRN

## 2018-09-16 MED ORDER — OXYCODONE HCL 5 MG PO TABS
5.0000 mg | ORAL_TABLET | Freq: Four times a day (QID) | ORAL | Status: DC | PRN
  Administered 2018-09-16 – 2018-09-19 (×3): 5 mg via ORAL
  Filled 2018-09-16 (×3): qty 1

## 2018-09-16 MED ORDER — FUROSEMIDE 40 MG PO TABS
20.0000 mg | ORAL_TABLET | Freq: Every day | ORAL | Status: DC
  Administered 2018-09-16: 23:00:00 20 mg via ORAL
  Filled 2018-09-16: qty 1

## 2018-09-16 MED ORDER — ATORVASTATIN CALCIUM 20 MG PO TABS
10.0000 mg | ORAL_TABLET | Freq: Every day | ORAL | Status: DC
  Administered 2018-09-17 – 2018-09-18 (×2): 10 mg via ORAL
  Filled 2018-09-16 (×2): qty 1

## 2018-09-16 MED ORDER — ACETAMINOPHEN 650 MG RE SUPP: 650.0000 mg | Freq: Four times a day (QID) | RECTAL | Status: DC | PRN

## 2018-09-16 MED ORDER — FUROSEMIDE 40 MG PO TABS: 20.0000 mg | ORAL_TABLET | Freq: Every day | ORAL | Status: DC

## 2018-09-16 MED ORDER — ONDANSETRON HCL 4 MG/2ML IJ SOLN: 4.0000 mg | Freq: Four times a day (QID) | INTRAMUSCULAR | Status: DC | PRN

## 2018-09-16 MED ORDER — LORAZEPAM 2 MG/ML IJ SOLN: 1.0000 mg | INTRAMUSCULAR | Status: AC

## 2018-09-16 MED ORDER — ACETAMINOPHEN 325 MG PO TABS
650.0000 mg | ORAL_TABLET | Freq: Four times a day (QID) | ORAL | Status: DC | PRN
  Administered 2018-09-17 – 2018-09-18 (×4): 650 mg via ORAL
  Filled 2018-09-16 (×4): qty 2

## 2018-09-16 MED ORDER — TAMSULOSIN HCL 0.4 MG PO CAPS
0.4000 mg | ORAL_CAPSULE | Freq: Every day | ORAL | Status: DC
  Administered 2018-09-17 – 2018-09-19 (×3): 0.4 mg via ORAL
  Filled 2018-09-16 (×3): qty 1

## 2018-09-16 MED ORDER — ENOXAPARIN SODIUM 40 MG/0.4ML ~~LOC~~ SOLN
40.0000 mg | Freq: Every day | SUBCUTANEOUS | Status: DC
  Administered 2018-09-17 – 2018-09-18 (×3): 40 mg via SUBCUTANEOUS
  Filled 2018-09-16 (×4): qty 0.4

## 2018-09-16 NOTE — ED Provider Notes (Addendum)
[facility] Emergency Department Provider Note   ____________________________________________   First MD Initiated Contact with Patient 09/16/18 1745     (approximate)  I have reviewed the triage vital signs and the nursing notes.   HISTORY  Chief Complaint Weakness    HPI Shane Lindsey is a 83 y.o. male complains of some trouble sleeping at night.  He says his legs are getting stiff and swollen.  He is also getting weak and possibly a little confusion as well.  He does not seem to have any chest pain or tightness.  He is not running a fever that I can tell.  He is not having a cough.  Son tells the nurse that he is very weak and is falling a lot.  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        Past Medical History:  Diagnosis Date  . Hyperlipemia   . Hypertension     There are no active problems to display for this patient.   Past Surgical History:  Procedure Laterality Date  . TONSILLECTOMY      Prior to Admission medications   Medication Sig Start Date End Date Taking? Authorizing Provider  lisinopril (ZESTRIL) 30 MG tablet Take 30 mg by mouth daily.    [provider]  Polyethyl Glycol-Propyl Glycol (SYSTANE OP) Place 1 drop into both eyes daily as needed (dry eyes).    [provider]  tamsulosin (FLOMAX) 0.4 MG CAPS capsule Take 0.4 mg by mouth daily.    [provider]  traMADol (ULTRAM) 50 MG tablet Take 1 tablet (50 mg total) by mouth every 6 (six) hours as needed for moderate pain. Patient not taking: Reported on 07/31/2018 03/04/15   Joni ReiningSmith, Ronald K, PA-C    Allergies Patient has no known allergies.  No family history on file.  Social History Social History   Tobacco Use  . Smoking status: Never Smoker  . Smokeless tobacco: Never Used  Substance Use Topics  .  Alcohol use: Yes  . Drug use: No    Review of Systems  Constitutional: No fever/chills Eyes: No visual changes. ENT: No sore throat. Cardiovascular: Denies chest pain. Respiratory: Denies shortness of breath. Gastrointestinal: No abdominal pain.  No nausea, no vomiting.  No diarrhea.  No constipation. Genitourinary: Negative for dysuria. Musculoskeletal: Negative for back pain. Skin: Negative for rash. Neurological: Negative for headaches, focal weakness or   ____________________________________________   PHYSICAL EXAM:  VITAL SIGNS: ED Triage Vitals  Enc Vitals Group     BP 09/16/18 1206 109/64     Pulse Rate 09/16/18 1206 97     Resp 09/16/18 1206 18     Temp 09/16/18 1206 98.8 F (37.1 C)     Temp Source 09/16/18 1206 Oral     SpO2 09/16/18 1206 99 %     Weight 09/16/18 1205 157 lb (71.2 kg)     Height 09/16/18 1205 5\' 10"  (1.778 m)     Head Circumference --      Peak Flow --      Pain Score 09/16/18 1202 0     Pain Loc --      Pain Edu? --      Excl. in GC? --     Constitutional: Alert and oriented. Well appearing and in no acute  distress. Eyes: Conjunctivae are normal.  Head: Atraumatic. Nose: No congestion/rhinnorhea. Mouth/Throat: Mucous membranes are moist.  Oropharynx non-erythematous. Neck: No stridor.   Cardiovascular: Normal rate, regular rhythm. Grossly normal heart sounds.  Good peripheral circulation. Respiratory: Normal respiratory effort.  No retractions. Lungs scattered basilar crackles Gastrointestinal: Soft and nontender. No distention. No abdominal bruits. No CVA tenderness. Musculoskeletal: No lower extremity tenderness 2+ bilateral edema edema to the knees are higher.   Neurologic:  Normal speech and language. No gross focal neurologic deficits are appreciated.  Skin:  Skin is warm, dry and intact. No rash noted.   ____________________________________________   LABS (all labs ordered are listed, but only abnormal results are displayed)   Labs Reviewed  BASIC METABOLIC PANEL - Abnormal; Notable for the following components:      Result Value   Potassium 3.0 (*)    Chloride 97 (*)    Glucose, Bld 201 (*)    BUN 25 (*)    Creatinine, Ser 1.63 (*)    Calcium 8.0 (*)    GFR calc non Af Amer 39 (*)    GFR calc Af Amer 45 (*)    All other components within normal limits  CBC - Abnormal; Notable for the following components:   WBC 11.2 (*)    RBC 2.97 (*)    Hemoglobin 11.3 (*)    HCT 31.2 (*)    MCV 105.1 (*)    MCH 38.0 (*)    MCHC 36.2 (*)    Platelets 144 (*)    All other components within normal limits  BRAIN NATRIURETIC PEPTIDE - Abnormal; Notable for the following components:   B Natriuretic Peptide 226.0 (*)    All other components within normal limits  MAGNESIUM - Abnormal; Notable for the following components:   Magnesium 1.6 (*)    All other components within normal limits  TROPONIN I (HIGH SENSITIVITY) - Abnormal; Notable for the following components:   Troponin I (High Sensitivity) 20 (*)    All other components within normal limits  URINALYSIS, COMPLETE (UACMP) WITH MICROSCOPIC   ____________________________________________  EKG  EKG read interpreted by me shows normal sinus rhythm rate of 98 left axis decreased R wave progression some flattening inferiorly of the T waves otherwise no acute changes ____________________________________________  RADIOLOGY  ED MD interpretation: CT head and neck read by radiology reviewed by me show no acute fractures there is an old fracture of the distal clavicle.  Official radiology report(s): No results found.  ____________________________________________   PROCEDURES  Procedure(s) performed (including Critical Care):  Procedures   ____________________________________________   INITIAL IMPRESSION / ASSESSMENT AND PLAN / ED COURSE Further questioning reveals patient's been having bilateral thigh pain that gets better with walking.  He reports he  has been having increasing swelling for about 2 weeks.  Apparently has had this once before he got better with compression stockings and got worse again.  He had normal renal function at the end of August. I cannot easily diurese him to get his swelling down see if he gets less tired without worsening his renal function as well.  I think we should get him in and evaluate him further and then work on improving his medical problems      MORDCHE HEDGLIN was evaluated in Emergency Department on 09/16/2018 for the symptoms described in the history of present illness. He was evaluated in the context of the global COVID-19 pandemic, which necessitated consideration that the patient might be at risk for  infection with the SARS-CoV-2 virus that causes COVID-19. Institutional protocols and algorithms that pertain to the evaluation of patients at risk for COVID-19 are in a state of rapid change based on information released by regulatory bodies including the CDC and federal and state organizations. These policies and algorithms were followed during the patient's care in the ED.       ____________________________________________   FINAL CLINICAL IMPRESSION(S) / ED DIAGNOSES  Final diagnoses:  Weakness  AKI (acute kidney injury) (HCC)  Congestive heart failure, unspecified HF chronicity, unspecified heart failure type (HCC)  Elevated troponin     ED Discharge Orders    None       Note:  This document was prepared using Dragon voice recognition software and may include unintentional dictation errors.    Arnaldo Natal, MD 09/16/18 1950    Arnaldo Natal, MD 09/16/18 2041    Arnaldo Natal, MD 09/16/18 2120

## 2018-09-16 NOTE — H&P (Addendum)
[facility] [place] [facility]   PATIENT NAME: Shane Lindsey    MR#:  [id number]  DATE OF BIRTH:  06/11/1935  DATE OF ADMISSION:  09/16/2018  PRIMARY CARE PHYSICIAN: Jaclyn Shaggyate, Denny C, MD   REQUESTING/REFERRING PHYSICIAN: Darnelle CatalanMalinda, MD  CHIEF COMPLAINT:   Chief Complaint  Patient presents with  . Weakness    HISTORY OF PRESENT ILLNESS:  Shane Lindsey  is a 83 y.o. male who presents with chief complaint as above.  Patient presents the ED with a complaint of lower extremity swelling.  He states that this is been getting progressively worse for the past 2 to 3 days, and that his legs are somewhat painful now.  On evaluation here in the ED he is found to have an elevated BNP, bilateral lower extremity edema.  He has no prior history of heart failure.  He also has some mild AKI.  Of note, patient also had an ultrasound of his abdomen back in June which showed a cystic lesion in the tail of his pancreas.  MRI was recommended by radiology, but has not been done yet.  Family inquires about possibly getting the MRI done while he is here.  Hospitalist were called for admission  PAST MEDICAL HISTORY:   Past Medical History:  Diagnosis Date  . Hyperlipemia   . Hypertension      PAST SURGICAL HISTORY:   Past Surgical History:  Procedure Laterality Date  . TONSILLECTOMY       SOCIAL HISTORY:   Social History   Tobacco Use  . Smoking status: Never Smoker  . Smokeless tobacco: Never Used  Substance Use Topics  . Alcohol use: Yes     FAMILY HISTORY:    Family history reviewed and is non-contributory DRUG ALLERGIES:  No Known Allergies  MEDICATIONS AT HOME:   Prior to Admission medications   Medication Sig Start Date End Date Taking? Authorizing Provider  traMADol (ULTRAM) 50 MG tablet Take 1 tablet (50 mg total) by mouth every 6 (six) hours as needed for moderate pain. 03/04/15  Yes Joni ReiningSmith, Ronald K, PA-C  atorvastatin (LIPITOR) 10 MG tablet Take 10  mg by mouth daily. 07/04/18   [provider]  furosemide (LASIX) 20 MG tablet Take 20 mg by mouth daily as needed for fluid. 08/22/18   [provider]  lisinopril (ZESTRIL) 30 MG tablet Take 30 mg by mouth daily.    [provider]  Polyethyl Glycol-Propyl Glycol (SYSTANE OP) Place 1 drop into both eyes daily as needed (dry eyes).    [provider]  tamsulosin (FLOMAX) 0.4 MG CAPS capsule Take 0.4 mg by mouth daily.    [provider]    REVIEW OF SYSTEMS:  Review of Systems  Constitutional: Negative for chills, fever, malaise/fatigue and weight loss.  HENT: Negative for ear pain, hearing loss and tinnitus.   Eyes: Negative for blurred vision, double vision, pain and redness.  Respiratory: Negative for cough, hemoptysis and shortness of breath.   Cardiovascular: Positive for leg swelling. Negative for chest pain, palpitations and orthopnea.  Gastrointestinal: Negative for abdominal pain, constipation, diarrhea, nausea and vomiting.  Genitourinary: Negative for dysuria, frequency and hematuria.  Musculoskeletal: Negative for back pain, joint pain and neck pain.  Skin:       No acne, rash, or lesions  Neurological: Negative for dizziness, tremors, focal weakness and weakness.  Endo/Heme/Allergies: Negative for polydipsia. Does not bruise/bleed easily.  Psychiatric/Behavioral: Negative for depression. The patient is not nervous/anxious and  does not have insomnia.      VITAL SIGNS:   Vitals:   09/16/18 1800 09/16/18 1830 09/16/18 1900 09/16/18 1930  BP: 117/71 122/71 126/69 123/77  Pulse: 74 82 79 80  Resp: 17 17 17 18   Temp:      TempSrc:      SpO2: 100% 100% 100% 99%  Weight:      Height:       Wt Readings from Last 3 Encounters:  09/16/18 71.2 kg  08/10/18 71.2 kg  03/10/15 82.1 kg    PHYSICAL EXAMINATION:  Physical Exam  Vitals reviewed. Constitutional: He is oriented to person, place, and time. He appears well-developed and  well-nourished. No distress.  HENT:  Head: Normocephalic and atraumatic.  Mouth/Throat: Oropharynx is clear and moist.  Eyes: Pupils are equal, round, and reactive to light. Conjunctivae and EOM are normal. No scleral icterus.  Neck: Normal range of motion. Neck supple. No JVD present. No thyromegaly present.  Cardiovascular: Normal rate, regular rhythm and intact distal pulses. Exam reveals no gallop and no friction rub.  Murmur heard. Respiratory: Effort normal and breath sounds normal. No respiratory distress. He has no wheezes. He has no rales.  GI: Soft. Bowel sounds are normal. He exhibits no distension. There is no abdominal tenderness.  Musculoskeletal: Normal range of motion.        General: Edema (Bilateral lower extremities) present.     Comments: No arthritis, no gout  Lymphadenopathy:    He has no cervical adenopathy.  Neurological: He is alert and oriented to person, place, and time. No cranial nerve deficit.  No dysarthria, no aphasia  Skin: Skin is warm and dry. No rash noted. No erythema.  Psychiatric: He has a normal mood and affect. His behavior is normal. Judgment and thought content normal.    LABORATORY PANEL:   CBC Recent Labs  Lab 09/16/18 1224  WBC 11.2*  HGB 11.3*  HCT 31.2*  PLT 144*   ------------------------------------------------------------------------------------------------------------------  Chemistries  Recent Labs  Lab 09/16/18 1224  NA 136  K 3.0*  CL 97*  CO2 27  GLUCOSE 201*  BUN 25*  CREATININE 1.63*  CALCIUM 8.0*  MG 1.6*   ------------------------------------------------------------------------------------------------------------------  Cardiac Enzymes No results for input(s): TROPONINI in the last 168 hours. ------------------------------------------------------------------------------------------------------------------  RADIOLOGY:  Ct Head Wo Contrast  Result Date: 09/16/2018 CLINICAL DATA:  Head trauma, found down  and difficulty walking EXAM: CT HEAD WITHOUT CONTRAST CT CERVICAL SPINE WITHOUT CONTRAST TECHNIQUE: Multidetector CT imaging of the head and cervical spine was performed following the standard protocol without intravenous contrast. Multiplanar CT image reconstructions of the cervical spine were also generated. COMPARISON:  August 10, 2018 FINDINGS: CT HEAD FINDINGS Brain: No evidence of acute territorial infarction, hemorrhage, hydrocephalus,extra-axial collection or mass lesion/mass effect. There is dilatation the ventricles and sulci consistent with age-related atrophy. Low-attenuation changes in the deep white matter consistent with small vessel ischemia. Vascular: No hyperdense vessel or unexpected calcification. Skull: The skull is intact. No fracture or focal lesion identified. Sinuses/Orbits: There is fluid seen within the right maxillary sinus. The orbits and globes intact. Other: None CT CERVICAL SPINE FINDINGS Alignment: There is slight straightening of the normal cervical lordosis. Skull base and vertebrae: Visualized skull base is intact. No atlanto-occipital dissociation. The vertebral body heights are well maintained. No fracture or pathologic osseous lesion seen. Soft tissues and spinal canal: The visualized paraspinal soft tissues are unremarkable. No prevertebral soft tissue swelling is seen. The spinal canal is grossly unremarkable,  no large epidural collection or significant canal narrowing. Disc levels: There is disc osteophyte complex and uncovertebral osteophytes most notable at C5-C6 and C6-C7 as on the recent prior exam. Upper chest: There is a partially visualized intra-articular comminuted fracture of the distal clavicle at the sternoclavicular joint. Other: Scattered vascular calcifications are seen. IMPRESSION: 1. No acute intracranial abnormality. 2. Findings consistent with age related atrophy and chronic small vessel ischemia 3.  No acute fracture or malalignment of the spine. 4.  Subacute partially visualized distal clavicle fracture at the sternoclavicular joint which was seen on prior exam August 10, 2018. These results were called by telephone at the time of interpretation on 09/16/2018 at 8:41 pm to Dr. Dorothea GlassmanPAUL MALINDA , who verbally acknowledged these results. Electronically Signed   By: Jonna ClarkBindu  Avutu M.D.   On: 09/16/2018 20:41   Ct Cervical Spine Wo Contrast  Result Date: 09/16/2018 CLINICAL DATA:  Head trauma, found down and difficulty walking EXAM: CT HEAD WITHOUT CONTRAST CT CERVICAL SPINE WITHOUT CONTRAST TECHNIQUE: Multidetector CT imaging of the head and cervical spine was performed following the standard protocol without intravenous contrast. Multiplanar CT image reconstructions of the cervical spine were also generated. COMPARISON:  August 10, 2018 FINDINGS: CT HEAD FINDINGS Brain: No evidence of acute territorial infarction, hemorrhage, hydrocephalus,extra-axial collection or mass lesion/mass effect. There is dilatation the ventricles and sulci consistent with age-related atrophy. Low-attenuation changes in the deep white matter consistent with small vessel ischemia. Vascular: No hyperdense vessel or unexpected calcification. Skull: The skull is intact. No fracture or focal lesion identified. Sinuses/Orbits: There is fluid seen within the right maxillary sinus. The orbits and globes intact. Other: None CT CERVICAL SPINE FINDINGS Alignment: There is slight straightening of the normal cervical lordosis. Skull base and vertebrae: Visualized skull base is intact. No atlanto-occipital dissociation. The vertebral body heights are well maintained. No fracture or pathologic osseous lesion seen. Soft tissues and spinal canal: The visualized paraspinal soft tissues are unremarkable. No prevertebral soft tissue swelling is seen. The spinal canal is grossly unremarkable, no large epidural collection or significant canal narrowing. Disc levels: There is disc osteophyte complex and uncovertebral  osteophytes most notable at C5-C6 and C6-C7 as on the recent prior exam. Upper chest: There is a partially visualized intra-articular comminuted fracture of the distal clavicle at the sternoclavicular joint. Other: Scattered vascular calcifications are seen. IMPRESSION: 1. No acute intracranial abnormality. 2. Findings consistent with age related atrophy and chronic small vessel ischemia 3.  No acute fracture or malalignment of the spine. 4. Subacute partially visualized distal clavicle fracture at the sternoclavicular joint which was seen on prior exam August 10, 2018. These results were called by telephone at the time of interpretation on 09/16/2018 at 8:41 pm to Dr. Dorothea GlassmanPAUL MALINDA , who verbally acknowledged these results. Electronically Signed   By: Jonna ClarkBindu  Avutu M.D.   On: 09/16/2018 20:41   Dg Chest Portable 1 View  Result Date: 09/16/2018 CLINICAL DATA:  Shortness of breath, edema EXAM: PORTABLE CHEST 1 VIEW COMPARISON:  08/10/2018 FINDINGS: Cardiomegaly. Coarsened interstitial prominence throughout the lungs is similar prior study, likely chronic interstitial lung disease. Low volumes. No confluent opacities, effusions or overt edema. No acute bony abnormality. IMPRESSION: Mild cardiomegaly. Chronic interstitial prominence likely reflects chronic interstitial lung disease. Electronically Signed   By: Charlett NoseKevin  Dover M.D.   On: 09/16/2018 20:11    EKG:   Orders placed or performed during the hospital encounter of 09/16/18  . ED EKG  . ED EKG  IMPRESSION AND PLAN:  Principal Problem:   AKI (acute kidney injury) (HCC) -patient does need some diuresis, but I suspect his AKI may be partly due to his Lasix use.  We will give him 1 dose of p.o. Lasix tonight, then hold Lasix from there.  AKI could also be partly related to exacerbation of heart failure.  See below for work-up Active Problems:   CHF (congestive heart failure) (HCC) -no prior diagnosis, unspecified chronicity, unspecified type.  Patient  needs echocardiogram.   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 Labs, imaging and/or ECG reviewed by provider and discussed with patient/family. Management plans discussed with the patient and/or family.  COVID-19 status: Pending  DVT PROPHYLAXIS: SubQ lovenox   GI PROPHYLAXIS:  None  ADMISSION STATUS: Observation    CODE STATUS: Full Advance Directive Documentation     Most Recent Value  Type of Advance Directive  Healthcare Power of Attorney, Living will  Pre-existing out of facility DNR order (yellow form or pink MOST form)  -  "MOST" Form in Place?  -      TOTAL TIME TAKING CARE OF THIS PATIENT: 40 minutes.   This patient was evaluated in the context of the global COVID-19 pandemic, which necessitated consideration that the patient might be at risk for infection with the SARS-CoV-2 virus that causes COVID-19. Institutional protocols and algorithms that pertain to the evaluation of patients at risk for COVID-19 are in a state of rapid change based on information released by regulatory bodies including the CDC and federal and state organizations. These policies and algorithms were followed to the best of this provider's knowledge to date during the patient's care at this facility.  Barney Drain 09/16/2018, 9:49 PM  [facility]  Office  [phone]  CC: Primary care physician; Jaclyn Shaggy, MD  Note:  This document was prepared using Dragon voice recognition software and may include unintentional dictation errors.

## 2018-09-16 NOTE — ED Notes (Signed)
Report called - pt is to go to [facility] but needs medicated first, then can go to his room

## 2018-09-16 NOTE — ED Notes (Signed)
Pt states he doesn't feel like he needs anything more than the oxycodone and is relaxed enough for mri. Pt taken to [facility]

## 2018-09-16 NOTE — ED Triage Notes (Addendum)
Pt arrived via POV with reports of leg stiffness and balance issues.  Pt reports being treated by Dr. Abbott Pao is poor historian. However, pt states he has not been sleeping well for several nights. Pt states he was put on some medication for the leg stiffness, but unsure of what it is called.  Pt has hx of etoh use, states he drank 2oz liquor yesterday.

## 2018-09-16 NOTE — ED Notes (Signed)
Pt meds, Lasix 20mg  as needed, Lipitor 10mg , Lisinopril 5mg  Fioricet

## 2018-09-16 NOTE — ED Notes (Addendum)
Tues or Wed morning, family found pt on the floor in the bedroom, pt had difficulty walking. Legs swelling.  Pt's son called PCP on Thursday - legs swelling from hip down.  Son states this morning, pt slid off bed and pt unable to get up with out much assistance.  Son also reports some confusion as well.

## 2018-09-16 NOTE — ED Notes (Signed)
Pt helped to stand at the end of the bed to urinate. Unsteady on his feet trying to get into position, but able to stand on his own once up. Multiple abrasions and skin tears in various stages of healing noted.

## 2018-09-17 ENCOUNTER — Observation Stay
Admit: 2018-09-17 | Discharge: 2018-09-17 | Disposition: A | Discharge status: Home / Self Care | Payer: Medicare Other | Attending: Internal Medicine | Admitting: Internal Medicine

## 2018-09-17 DIAGNOSIS — R531 Weakness: Secondary | ICD-10-CM | POA: Diagnosis present

## 2018-09-17 DIAGNOSIS — N179 Acute kidney failure, unspecified: Secondary | ICD-10-CM | POA: Diagnosis present

## 2018-09-17 DIAGNOSIS — I110 Hypertensive heart disease with heart failure: Secondary | ICD-10-CM | POA: Diagnosis present

## 2018-09-17 DIAGNOSIS — E876 Hypokalemia: Secondary | ICD-10-CM | POA: Diagnosis present

## 2018-09-17 DIAGNOSIS — Z79899 Other long term (current) drug therapy: Secondary | ICD-10-CM | POA: Diagnosis not present

## 2018-09-17 DIAGNOSIS — T502X5A Adverse effect of carbonic-anhydrase inhibitors, benzothiadiazides and other diuretics, initial encounter: Secondary | ICD-10-CM | POA: Diagnosis present

## 2018-09-17 DIAGNOSIS — E785 Hyperlipidemia, unspecified: Secondary | ICD-10-CM | POA: Diagnosis present

## 2018-09-17 DIAGNOSIS — K862 Cyst of pancreas: Secondary | ICD-10-CM | POA: Diagnosis present

## 2018-09-17 DIAGNOSIS — I509 Heart failure, unspecified: Secondary | ICD-10-CM | POA: Diagnosis present

## 2018-09-17 DIAGNOSIS — Z20828 Contact with and (suspected) exposure to other viral communicable diseases: Secondary | ICD-10-CM | POA: Diagnosis present

## 2018-09-17 LAB — BASIC METABOLIC PANEL
Anion gap: 10 (ref 5–15)
BUN: 23 mg/dL (ref 8–23)
CO2: 29 mmol/L (ref 22–32)
Calcium: 7.9 mg/dL — ABNORMAL LOW (ref 8.9–10.3)
Chloride: 98 mmol/L (ref 98–111)
Creatinine, Ser: 1.38 mg/dL — ABNORMAL HIGH (ref 0.61–1.24)
GFR calc Af Amer: 55 mL/min — ABNORMAL LOW (ref >60)
GFR calc non Af Amer: 48 mL/min — ABNORMAL LOW (ref >60)
Glucose, Bld: 122 mg/dL — ABNORMAL HIGH (ref 70–99)
Potassium: 3.5 mmol/L (ref 3.5–5.1)
Sodium: 137 mmol/L (ref 135–145)

## 2018-09-17 LAB — TROPONIN I (HIGH SENSITIVITY): Troponin I (High Sensitivity): 20 ng/L — ABNORMAL HIGH (ref <18)

## 2018-09-17 LAB — CBC
HCT: 29.9 % — ABNORMAL LOW (ref 39.0–52.0)
Hemoglobin: 10.6 g/dL — ABNORMAL LOW (ref 13.0–17.0)
MCH: 38 pg — ABNORMAL HIGH (ref 26.0–34.0)
MCHC: 35.5 g/dL (ref 30.0–36.0)
MCV: 107.2 fL — ABNORMAL HIGH (ref 80.0–100.0)
Platelets: 148 10*3/uL — ABNORMAL LOW (ref 150–400)
RBC: 2.79 MIL/uL — ABNORMAL LOW (ref 4.22–5.81)
RDW: 14.4 % (ref 11.5–15.5)
WBC: 9.1 10*3/uL (ref 4.0–10.5)
nRBC: 0 % (ref 0.0–0.2)

## 2018-09-17 LAB — SARS CORONAVIRUS 2 (TAT 6-24 HRS): SARS Coronavirus 2: NEGATIVE

## 2018-09-17 MED ORDER — GADOBUTROL 1 MMOL/ML IV SOLN
7.0000 mL | Freq: Once | INTRAVENOUS | Status: AC | PRN
  Administered 2018-09-17: 7 mL via INTRAVENOUS

## 2018-09-17 MED ORDER — FUROSEMIDE 10 MG/ML IJ SOLN
20.0000 mg | Freq: Once | INTRAMUSCULAR | Status: AC
  Administered 2018-09-17: 13:00:00 20 mg via INTRAVENOUS
  Filled 2018-09-17: qty 2

## 2018-09-17 NOTE — Care Management Obs Status (Signed)
[facility] NOTIFICATION   Patient Details  Name: Shane Lindsey MRN: [id number] Date of Birth: August 08, 1935   Medicare Observation Status Notification Given:  Yes    Braycen Burandt A Shellene Sweigert, RN 09/17/2018, 10:22 AM

## 2018-09-17 NOTE — Progress Notes (Signed)
[employer] [facility] [place] [facility]   PATIENT NAME: Shane Lindsey    MR#:  [id number]  DATE OF BIRTH:  12-03-1935  SUBJECTIVE:  CHIEF COMPLAINT:   Chief Complaint  Patient presents with  . Weakness   Came with complaint of leg edema and orthopnea with weakness. Feels slightly better already after dialysis. He lives with his wife who is more debilitated and he is primary caregiver for her at home. He said he had good sleep last night.  REVIEW OF SYSTEMS:  CONSTITUTIONAL: No fever, fatigue or weakness.  EYES: No blurred or double vision.  EARS, NOSE, AND THROAT: No tinnitus or ear pain.  RESPIRATORY: No cough, shortness of breath, wheezing or hemoptysis.  CARDIOVASCULAR: No chest pain, have orthopnea, edema.  GASTROINTESTINAL: No nausea, vomiting, diarrhea or abdominal pain.  GENITOURINARY: No dysuria, hematuria.  ENDOCRINE: No polyuria, nocturia,  HEMATOLOGY: No anemia, easy bruising or bleeding SKIN: No rash or lesion. MUSCULOSKELETAL: No joint pain or arthritis.   NEUROLOGIC: No tingling, numbness, weakness.  PSYCHIATRY: No anxiety or depression.   ROS  DRUG ALLERGIES:  No Known Allergies  VITALS:  Blood pressure 117/70, pulse 89, temperature 98.2 F (36.8 C), temperature source Oral, resp. rate 17, height 5\' 10"  (1.778 m), weight 71.2 kg, SpO2 100 %.  PHYSICAL EXAMINATION:  GENERAL:  83 y.o.-year-old patient lying in the bed with no acute distress.  EYES: Pupils equal, round, reactive to light and accommodation. No scleral icterus. Extraocular muscles intact.  HEENT: Head atraumatic, normocephalic. Oropharynx and nasopharynx clear.  NECK:  Supple, no jugular venous distention. No thyroid enlargement, no tenderness.  LUNGS: Normal breath sounds bilaterally, no wheezing, some crepitation. No use of accessory muscles of respiration.  CARDIOVASCULAR: S1, S2 normal. No murmurs, rubs, or gallops.  ABDOMEN: Soft, nontender, nondistended. Bowel sounds  present. No organomegaly or mass.  EXTREMITIES: Minimal pedal edema,no cyanosis, or clubbing.  NEUROLOGIC: Cranial nerves II through XII are intact. Muscle strength 5/5 in all extremities. Sensation intact. Gait not checked.  PSYCHIATRIC: The patient is alert and oriented x 3.  SKIN: No obvious rash, lesion, or ulcer.   Physical Exam LABORATORY PANEL:   CBC Recent Labs  Lab 09/17/18 0136  WBC 9.1  HGB 10.6*  HCT 29.9*  PLT 148*   ------------------------------------------------------------------------------------------------------------------  Chemistries  Recent Labs  Lab 09/16/18 1224 09/17/18 0136  NA 136 137  K 3.0* 3.5  CL 97* 98  CO2 27 29  GLUCOSE 201* 122*  BUN 25* 23  CREATININE 1.63* 1.38*  CALCIUM 8.0* 7.9*  MG 1.6*  --    ------------------------------------------------------------------------------------------------------------------  Cardiac Enzymes No results for input(s): TROPONINI in the last 168 hours. ------------------------------------------------------------------------------------------------------------------  RADIOLOGY:  Ct Head Wo Contrast  Result Date: 09/16/2018 CLINICAL DATA:  Head trauma, found down and difficulty walking EXAM: CT HEAD WITHOUT CONTRAST CT CERVICAL SPINE WITHOUT CONTRAST TECHNIQUE: Multidetector CT imaging of the head and cervical spine was performed following the standard protocol without intravenous contrast. Multiplanar CT image reconstructions of the cervical spine were also generated. COMPARISON:  August 10, 2018 FINDINGS: CT HEAD FINDINGS Brain: No evidence of acute territorial infarction, hemorrhage, hydrocephalus,extra-axial collection or mass lesion/mass effect. There is dilatation the ventricles and sulci consistent with age-related atrophy. Low-attenuation changes in the deep white matter consistent with small vessel ischemia. Vascular: No hyperdense vessel or unexpected calcification. Skull: The skull is intact. No  fracture or focal lesion identified. Sinuses/Orbits: There is fluid seen within the right maxillary sinus. The orbits and globes  intact. Other: None CT CERVICAL SPINE FINDINGS Alignment: There is slight straightening of the normal cervical lordosis. Skull base and vertebrae: Visualized skull base is intact. No atlanto-occipital dissociation. The vertebral body heights are well maintained. No fracture or pathologic osseous lesion seen. Soft tissues and spinal canal: The visualized paraspinal soft tissues are unremarkable. No prevertebral soft tissue swelling is seen. The spinal canal is grossly unremarkable, no large epidural collection or significant canal narrowing. Disc levels: There is disc osteophyte complex and uncovertebral osteophytes most notable at C5-C6 and C6-C7 as on the recent prior exam. Upper chest: There is a partially visualized intra-articular comminuted fracture of the distal clavicle at the sternoclavicular joint. Other: Scattered vascular calcifications are seen. IMPRESSION: 1. No acute intracranial abnormality. 2. Findings consistent with age related atrophy and chronic small vessel ischemia 3.  No acute fracture or malalignment of the spine. 4. Subacute partially visualized distal clavicle fracture at the sternoclavicular joint which was seen on prior exam August 10, 2018. These results were called by telephone at the time of interpretation on 09/16/2018 at 8:41 pm to Dr. Conni Slipper , who verbally acknowledged these results. Electronically Signed   By: Prudencio Pair M.D.   On: 09/16/2018 20:41   Ct Cervical Spine Wo Contrast  Result Date: 09/16/2018 CLINICAL DATA:  Head trauma, found down and difficulty walking EXAM: CT HEAD WITHOUT CONTRAST CT CERVICAL SPINE WITHOUT CONTRAST TECHNIQUE: Multidetector CT imaging of the head and cervical spine was performed following the standard protocol without intravenous contrast. Multiplanar CT image reconstructions of the cervical spine were also  generated. COMPARISON:  August 10, 2018 FINDINGS: CT HEAD FINDINGS Brain: No evidence of acute territorial infarction, hemorrhage, hydrocephalus,extra-axial collection or mass lesion/mass effect. There is dilatation the ventricles and sulci consistent with age-related atrophy. Low-attenuation changes in the deep white matter consistent with small vessel ischemia. Vascular: No hyperdense vessel or unexpected calcification. Skull: The skull is intact. No fracture or focal lesion identified. Sinuses/Orbits: There is fluid seen within the right maxillary sinus. The orbits and globes intact. Other: None CT CERVICAL SPINE FINDINGS Alignment: There is slight straightening of the normal cervical lordosis. Skull base and vertebrae: Visualized skull base is intact. No atlanto-occipital dissociation. The vertebral body heights are well maintained. No fracture or pathologic osseous lesion seen. Soft tissues and spinal canal: The visualized paraspinal soft tissues are unremarkable. No prevertebral soft tissue swelling is seen. The spinal canal is grossly unremarkable, no large epidural collection or significant canal narrowing. Disc levels: There is disc osteophyte complex and uncovertebral osteophytes most notable at C5-C6 and C6-C7 as on the recent prior exam. Upper chest: There is a partially visualized intra-articular comminuted fracture of the distal clavicle at the sternoclavicular joint. Other: Scattered vascular calcifications are seen. IMPRESSION: 1. No acute intracranial abnormality. 2. Findings consistent with age related atrophy and chronic small vessel ischemia 3.  No acute fracture or malalignment of the spine. 4. Subacute partially visualized distal clavicle fracture at the sternoclavicular joint which was seen on prior exam August 10, 2018. These results were called by telephone at the time of interpretation on 09/16/2018 at 8:41 pm to Dr. Conni Slipper , who verbally acknowledged these results. Electronically Signed    By: Prudencio Pair M.D.   On: 09/16/2018 20:41   Mr Abdomen W Or Wo Contrast  Result Date: 09/17/2018 CLINICAL DATA:  Cystic mass in pancreatic tail on recent ultrasound. Weight loss. EXAM: MRI ABDOMEN WITHOUT AND WITH CONTRAST TECHNIQUE: Multiplanar multisequence MR imaging of the  abdomen was performed both before and after the administration of intravenous contrast. CONTRAST:  7 mL Gadavist COMPARISON:  Ultrasound on 07/26/2018 FINDINGS: Lower chest: Tiny bilateral pleural effusions. Bibasilar T2 hyperintense interstitial prominence suspicious for mild interstitial edema. Hepatobiliary: Exam is technically suboptimal due to patient motion and nonstandard dynamic postcontrast imaging. Diffuse hepatic steatosis. No hepatic masses identified. Enlarged caudate lobe and perihepatic venous collaterals are suspicious for cirrhosis in portal venous hypertension. Mild ascites noted. Gallbladder is unremarkable. Borderline dilatation of common bile duct is seen measuring 6 mm. A meniscus sign is seen at the distal common bile duct, and a distal common bile duct stone cannot be excluded. Pancreas: Exam is technically suboptimal due to patient motion and nonstandard dynamic postcontrast imaging. The pancreas is enlarged. 4 complex lesions are seen which show at least peripheral rim enhancement, although evaluation is limited by lack of conventional precontrast and subtraction imaging. These are located in the pancreatic head measuring 2.5 x 2.2 cm, the superior aspect of the pancreatic body adjacent to the celiac axis measuring 2.5 x 1.9 cm, and the pancreatic tail measuring 4.5 x 3.4 cm and 2.4 x 2.2 cm. There appears to be thrombosis of the splenic vein. This may represent pancreatitis complicated by pseudocysts, however intraductal papillary mucinous neoplasm or other pancreatic malignancy cannot be excluded. Spleen: Within normal limits in size and appearance, although splenic vein thrombosis is noted. Adrenals/Urinary  Tract: No masses identified. No evidence of hydronephrosis. Stomach/Bowel: Visualized portion unremarkable. Vascular/Lymphatic: Question 2.5 cm celiac axis lymph node versus lesion arising from the superior pancreatic body. Other:  None. Musculoskeletal:  No suspicious bone lesions identified. IMPRESSION: Technically suboptimal exam. Enlarged pancreas with 4 complex lesions measuring up to 4.5 cm. Differential diagnosis includes pancreatitis complicated by pseudocysts, and intraductal papillary mucinous neoplasm or other pancreatic malignancy. Consider further evaluation with triple phase pancreatic protocol abdomen CT without and with contrast. Question enlarged celiac axis lymph node versus lesion arising from the superior margin of the pancreatic body. Borderline biliary ductal dilatation with meniscus sign at the ampulla, which could represent a distal common bile duct stone. Diffuse hepatic steatosis and possible cirrhosis. 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 Mild ascites. Electronically Signed   By: Danae OrleansJohn A Stahl M.D.   On: 09/17/2018 05:13   Dg Chest Portable 1 View  Result Date: 09/16/2018 CLINICAL DATA:  Shortness of breath, edema EXAM: PORTABLE CHEST 1 VIEW COMPARISON:  08/10/2018 FINDINGS: Cardiomegaly. Coarsened interstitial prominence throughout the lungs is similar prior study, likely chronic interstitial lung disease. Low volumes. No confluent opacities, effusions or overt edema. No acute bony abnormality. IMPRESSION: Mild cardiomegaly. Chronic interstitial prominence likely reflects chronic interstitial lung disease. Electronically Signed   By: Charlett NoseKevin  Dover M.D.   On: 09/16/2018 20:11    ASSESSMENT AND PLAN:   Principal Problem:   AKI (acute kidney injury) (HCC) Active Problems:   HTN (hypertension)   CHF (congestive heart failure) (HCC)   HLD (hyperlipidemia)   Pancreas cyst  * AKI (acute kidney injury) (HCC) -patient does need some diuresis      Kidney function improved  some after having diuretics yesterday.  I will give 20 mg Lasix dose again today and watch for renal function.   *Acute CHF (congestive heart failure) (HCC) -no prior diagnosis, unspecified chronicity, unspecified type.    Ordered echocardiogram.  *  Pancreas cyst -seen on ultrasound a couple of months ago, MRI abdomen shows cyst versus mass and further work-up advised. I spoke to patient and her daughter on  phone and advised him to follow-up with cancer center within the next 2 weeks to have further work-up scheduled.  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  He was supposed to follow with urologist as outpatient to have a biopsy scheduled but he did not go until now. I encouraged patient and also spoke to his daughter on phone and advised him to have this follow-up appointments done within the next few weeks.     All the records are reviewed and case discussed with Care Management/Social Workerr. Management plans discussed with the patient, family and they are in agreement.  CODE STATUS: full.  TOTAL TIME TAKING CARE OF THIS PATIENT: 35 minutes.     POSSIBLE D/C IN 1-2 DAYS, DEPENDING ON CLINICAL CONDITION.   Altamese DillingVaibhavkumar Sharonica Kraszewski M.D on 09/17/2018   Between 7am to 6pm - Pager - [phone]  After 6pm go to [email or web address] - password [email or web address]  [facility]  Office  [phone]  CC: Primary care physician; Jaclyn Shaggyate, Denny C, MD  Note: This dictation was prepared with Dragon dictation along with smaller phrase technology. Any transcriptional errors that result from this process are unintentional.

## 2018-09-17 NOTE — Progress Notes (Signed)
Family Meeting Note  Advance Directive: yes.  Today a meeting took place with the patient.  The following clinical team members were present during this meeting: MD  The following were discussed:Patient's diagnosis: CHF, prostate mass and pancreatic mass which is suspected to be malignancy but further work-up needed, Patient's progosis: Unable to determine and Goals for treatment: full code.  Additional follow-up to be provided: Oncology  Time spent during discussion:20 min.  Vaughan Basta, MD

## 2018-09-17 NOTE — Plan of Care (Signed)
  Problem: Education: Goal: Knowledge of General Education information will improve Description: Including pain rating scale, medication(s)/side effects and non-pharmacologic comfort measures Outcome: Progressing   Problem: Health Behavior/Discharge Planning: Goal: Ability to manage health-related needs will improve Outcome: Progressing   Problem: Clinical Measurements: Goal: Ability to maintain clinical measurements within normal limits will improve Outcome: Progressing Goal: Diagnostic test results will improve Outcome: Progressing   Problem: Activity: Goal: Risk for activity intolerance will decrease Outcome: Progressing   Problem: Pain Managment: Goal: General experience of comfort will improve Outcome: Progressing   Problem: Safety: Goal: Ability to remain free from injury will improve Outcome: Progressing   Problem: Skin Integrity: Goal: Risk for impaired skin integrity will decrease Outcome: Progressing   

## 2018-09-17 NOTE — TOC Transition Note (Signed)
Transition of Care [facility]) - CM/SW Discharge Note   Patient Details  Name: Shane Lindsey MRN: [id number] Date of Birth: [birth year]  [facility] CM/SW Contact:  Latanya Maudlin, RN Phone Number: 09/17/2018, 10:04 AM   Clinical Narrative:   TOC consulted as there may be home health needs. Patient lives at home with his spouse for whom he is the primary caregiver since she parkinson's. Patient has had intermittent confusion. Spoke mostly with patients spouse and children over the phone. CMS Medicare.gov Compare Post Acute Care list reviewed with patient and family. They are agreeable to home health. Referral placed with Corene Cornea at [facility] care. Patient has a rolling walker.     Final next level of care: Home w Home Health Services Barriers to Discharge: Continued Medical Work up   Patient Goals and CMS Choice   CMS Medicare.gov Compare Post Acute Care list provided to:: Patient Choice offered to / list presented to : Patient, Spouse  Discharge Placement                       Discharge Plan and Services   Discharge Planning Services: CM Consult Post Acute Care Choice: Home Health                    HH Arranged: RN, PT, Nurse's Aide [facility] Agency: [place] ([facility]) Date [place]: 09/17/18 Time [place]: 1003 Representative spoke with at [place]: [place] ([place]) Interventions     Readmission Risk Interventions No flowsheet data found.

## 2018-09-17 NOTE — Progress Notes (Signed)
*  PRELIMINARY RESULTS* Echocardiogram 2D Echocardiogram has been performed.  Shane Lindsey Cregg Jutte 09/17/2018, 12:26 PM

## 2018-09-17 NOTE — Progress Notes (Signed)
This RN spoke with patient's son (Medical POA, Lilia Pro) who would like his father to have his pancreatic and prostate masses biopsied. Son states his father- our patient- has cancelled several follow up/biopsy appointments, as well as colonoscopy appointments though his sister died r/t colon cancer, and is concerned about this happening going forward as well. This RN informed Lilia Pro (son) he would likely need to follow up at the cancer center (based on Dr. Nichola Sizer note). Son voiced understanding and agreement in this plan. He knows he can call at any time with questions.

## 2018-09-18 LAB — MAGNESIUM: Magnesium: 1.5 mg/dL — ABNORMAL LOW (ref 1.7–2.4)

## 2018-09-18 LAB — POTASSIUM
Potassium: 2.8 mmol/L — ABNORMAL LOW (ref 3.5–5.1)
Potassium: 3 mmol/L — ABNORMAL LOW (ref 3.5–5.1)

## 2018-09-18 LAB — BASIC METABOLIC PANEL
Anion gap: 10 (ref 5–15)
BUN: 22 mg/dL (ref 8–23)
CO2: 28 mmol/L (ref 22–32)
Calcium: 7.6 mg/dL — ABNORMAL LOW (ref 8.9–10.3)
Chloride: 101 mmol/L (ref 98–111)
Creatinine, Ser: 1.11 mg/dL (ref 0.61–1.24)
GFR calc Af Amer: >60 mL/min (ref >60)
GFR calc non Af Amer: >60 mL/min (ref >60)
Glucose, Bld: 125 mg/dL — ABNORMAL HIGH (ref 70–99)
Potassium: 2.3 mmol/L — CL (ref 3.5–5.1)
Sodium: 139 mmol/L (ref 135–145)

## 2018-09-18 MED ORDER — POTASSIUM CHLORIDE CRYS ER 20 MEQ PO TBCR: 40.0000 meq | EXTENDED_RELEASE_TABLET | Freq: Two times a day (BID) | ORAL | Status: DC

## 2018-09-18 MED ORDER — RAMELTEON 8 MG PO TABS
8.0000 mg | ORAL_TABLET | Freq: Every day | ORAL | Status: DC
  Administered 2018-09-18: 22:00:00 8 mg via ORAL
  Filled 2018-09-18 (×2): qty 1

## 2018-09-18 MED ORDER — MAGNESIUM SULFATE 2 GM/50ML IV SOLN
2.0000 g | Freq: Once | INTRAVENOUS | Status: AC
  Administered 2018-09-18: 2 g via INTRAVENOUS
  Filled 2018-09-18: qty 50

## 2018-09-18 MED ORDER — POTASSIUM CHLORIDE 10 MEQ/100ML IV SOLN
10.0000 meq | INTRAVENOUS | Status: DC
  Administered 2018-09-18: 09:00:00 10 meq via INTRAVENOUS
  Filled 2018-09-18: qty 100

## 2018-09-18 MED ORDER — POTASSIUM CHLORIDE 10 MEQ/100ML IV SOLN
10.0000 meq | INTRAVENOUS | Status: AC
  Administered 2018-09-18 – 2018-09-19 (×6): 10 meq via INTRAVENOUS
  Filled 2018-09-18 (×6): qty 100

## 2018-09-18 MED ORDER — POTASSIUM CHLORIDE CRYS ER 20 MEQ PO TBCR
20.0000 meq | EXTENDED_RELEASE_TABLET | Freq: Two times a day (BID) | ORAL | Status: DC
  Administered 2018-09-18: 20 meq via ORAL
  Filled 2018-09-18: qty 1

## 2018-09-18 MED ORDER — POTASSIUM CHLORIDE 10 MEQ/100ML IV SOLN
10.0000 meq | INTRAVENOUS | Status: DC
  Administered 2018-09-18: 10 meq via INTRAVENOUS

## 2018-09-18 MED ORDER — POTASSIUM CHLORIDE CRYS ER 20 MEQ PO TBCR
40.0000 meq | EXTENDED_RELEASE_TABLET | Freq: Once | ORAL | Status: AC
  Administered 2018-09-18: 17:00:00 40 meq via ORAL
  Filled 2018-09-18: qty 2

## 2018-09-18 NOTE — Progress Notes (Signed)
PHARMACY CONSULT NOTE - FOLLOW UP  Pharmacy Consult for Electrolyte Monitoring and Replacement   Recent Labs: Potassium (mmol/L)  Date Value  09/18/2018 3.0 (L)   Magnesium (mg/dL)  Date Value  09/18/2018 1.5 (L)   Calcium (mg/dL)  Date Value  09/18/2018 7.6 (L)   Albumin (g/dL)  Date Value  08/10/2018 2.5 (L)   Sodium (mmol/L)  Date Value  09/18/2018 139     Assessment:Pt is hypokalemic K =2.8 @ 1215  Mg = 1.5  Goal of Therapy:  Electrolytes WNL's  Plan:  Pt received 64meq IV x 2 @ slowed rate (pt did not tolerate) and 29meq po.  Pt received 2g IV Mag  Will recheck Mag and BMP with am labs  9/7:  K @ 1800 = 3.0 Will order KCl 10 mEq IV X 6. Will recheck electrolytes with AM labs.   Orene Desanctis ,PharmD Clinical Pharmacist 09/18/2018 8:09 PM

## 2018-09-18 NOTE — Consult Note (Signed)
PHARMACY CONSULT NOTE - FOLLOW UP  Pharmacy Consult for Electrolyte Monitoring and Replacement   Recent Labs: Potassium (mmol/L)  Date Value  09/18/2018 2.8 (L)   Magnesium (mg/dL)  Date Value  09/18/2018 1.5 (L)   Calcium (mg/dL)  Date Value  09/18/2018 7.6 (L)   Albumin (g/dL)  Date Value  08/10/2018 2.5 (L)   Sodium (mmol/L)  Date Value  09/18/2018 139    Assessment: Pt is hypokalemic K =2.8 @ 1215  Mg = 1.5  Goal of Therapy:  Electrolytes WNL's  Plan:  Pt received 84meq IV x 2 @ slowed rate (pt did not tolerate) and 75meq po.  Pt received 2g IV Mag  Will recheck Mag and BMP with am labs  Lu Duffel ,PharmD Clinical Pharmacist 09/18/2018 2:21 PM

## 2018-09-18 NOTE — Progress Notes (Signed)
[facility] [place] NAME: Shane Lindsey    MR#:  [id number]  DATE OF BIRTH:  05-20-1935  SUBJECTIVE:  CHIEF COMPLAINT:   Chief Complaint  Patient presents with  . Weakness   Came with complaint of leg edema and orthopnea with weakness. Feels slightly better already after dialysis. He lives with his wife who is more debilitated and he is primary caregiver for her at home. He said he had good sleep last night. He had some burning sensation in IV line in his arm with IV potassium today.  REVIEW OF SYSTEMS:  CONSTITUTIONAL: No fever, fatigue or weakness.  EYES: No blurred or double vision.  EARS, NOSE, AND THROAT: No tinnitus or ear pain.  RESPIRATORY: No cough, shortness of breath, wheezing or hemoptysis.  CARDIOVASCULAR: No chest pain, have orthopnea, edema.  GASTROINTESTINAL: No nausea, vomiting, diarrhea or abdominal pain.  GENITOURINARY: No dysuria, hematuria.  ENDOCRINE: No polyuria, nocturia,  HEMATOLOGY: No anemia, easy bruising or bleeding SKIN: No rash or lesion. MUSCULOSKELETAL: No joint pain or arthritis.   NEUROLOGIC: No tingling, numbness, weakness.  PSYCHIATRY: No anxiety or depression.   ROS  DRUG ALLERGIES:  No Known Allergies  VITALS:  Blood pressure 122/66, pulse 80, temperature 98.3 F (36.8 C), resp. rate 19, height 5\' 10"  (1.778 m), weight 71.2 kg, SpO2 99 %.  PHYSICAL EXAMINATION:  GENERAL:  83 y.o.-year-old patient lying in the bed with no acute distress.  EYES: Pupils equal, round, reactive to light and accommodation. No scleral icterus. Extraocular muscles intact.  HEENT: Head atraumatic, normocephalic. Oropharynx and nasopharynx clear.  NECK:  Supple, no jugular venous distention. No thyroid enlargement, no tenderness.  LUNGS: Normal breath sounds bilaterally, no wheezing, some crepitation. No use of accessory muscles of respiration.  CARDIOVASCULAR: S1, S2 normal. No murmurs, rubs, or gallops.   ABDOMEN: Soft, nontender, nondistended. Bowel sounds present. No organomegaly or mass.  EXTREMITIES: Minimal pedal edema,no cyanosis, or clubbing.  NEUROLOGIC: Cranial nerves II through XII are intact. Muscle strength 5/5 in all extremities. Sensation intact. Gait not checked.  PSYCHIATRIC: The patient is alert and oriented x 3.  SKIN: No obvious rash, lesion, or ulcer.   Physical Exam LABORATORY PANEL:   CBC Recent Labs  Lab 09/17/18 0136  WBC 9.1  HGB 10.6*  HCT 29.9*  PLT 148*   ------------------------------------------------------------------------------------------------------------------  Chemistries  Recent Labs  Lab 09/18/18 0524  NA 139  K 2.3*  CL 101  CO2 28  GLUCOSE 125*  BUN 22  CREATININE 1.11  CALCIUM 7.6*  MG 1.5*   ------------------------------------------------------------------------------------------------------------------  Cardiac Enzymes No results for input(s): TROPONINI in the last 168 hours. ------------------------------------------------------------------------------------------------------------------  RADIOLOGY:  Ct Head Wo Contrast  Result Date: 09/16/2018 CLINICAL DATA:  Head trauma, found down and difficulty walking EXAM: CT HEAD WITHOUT CONTRAST CT CERVICAL SPINE WITHOUT CONTRAST TECHNIQUE: Multidetector CT imaging of the head and cervical spine was performed following the standard protocol without intravenous contrast. Multiplanar CT image reconstructions of the cervical spine were also generated. COMPARISON:  August 10, 2018 FINDINGS: CT HEAD FINDINGS Brain: No evidence of acute territorial infarction, hemorrhage, hydrocephalus,extra-axial collection or mass lesion/mass effect. There is dilatation the ventricles and sulci consistent with age-related atrophy. Low-attenuation changes in the deep white matter consistent with small vessel ischemia. Vascular: No hyperdense vessel or unexpected calcification. Skull: The skull is intact. No  fracture or focal lesion identified. Sinuses/Orbits: There is fluid seen within the right maxillary sinus. The orbits and globes intact.  Other: None CT CERVICAL SPINE FINDINGS Alignment: There is slight straightening of the normal cervical lordosis. Skull base and vertebrae: Visualized skull base is intact. No atlanto-occipital dissociation. The vertebral body heights are well maintained. No fracture or pathologic osseous lesion seen. Soft tissues and spinal canal: The visualized paraspinal soft tissues are unremarkable. No prevertebral soft tissue swelling is seen. The spinal canal is grossly unremarkable, no large epidural collection or significant canal narrowing. Disc levels: There is disc osteophyte complex and uncovertebral osteophytes most notable at C5-C6 and C6-C7 as on the recent prior exam. Upper chest: There is a partially visualized intra-articular comminuted fracture of the distal clavicle at the sternoclavicular joint. Other: Scattered vascular calcifications are seen. IMPRESSION: 1. No acute intracranial abnormality. 2. Findings consistent with age related atrophy and chronic small vessel ischemia 3.  No acute fracture or malalignment of the spine. 4. Subacute partially visualized distal clavicle fracture at the sternoclavicular joint which was seen on prior exam August 10, 2018. These results were called by telephone at the time of interpretation on 09/16/2018 at 8:41 pm to Dr. Dorothea GlassmanPAUL MALINDA , who verbally acknowledged these results. Electronically Signed   By: Jonna ClarkBindu  Avutu M.D.   On: 09/16/2018 20:41   Ct Cervical Spine Wo Contrast  Result Date: 09/16/2018 CLINICAL DATA:  Head trauma, found down and difficulty walking EXAM: CT HEAD WITHOUT CONTRAST CT CERVICAL SPINE WITHOUT CONTRAST TECHNIQUE: Multidetector CT imaging of the head and cervical spine was performed following the standard protocol without intravenous contrast. Multiplanar CT image reconstructions of the cervical spine were also  generated. COMPARISON:  August 10, 2018 FINDINGS: CT HEAD FINDINGS Brain: No evidence of acute territorial infarction, hemorrhage, hydrocephalus,extra-axial collection or mass lesion/mass effect. There is dilatation the ventricles and sulci consistent with age-related atrophy. Low-attenuation changes in the deep white matter consistent with small vessel ischemia. Vascular: No hyperdense vessel or unexpected calcification. Skull: The skull is intact. No fracture or focal lesion identified. Sinuses/Orbits: There is fluid seen within the right maxillary sinus. The orbits and globes intact. Other: None CT CERVICAL SPINE FINDINGS Alignment: There is slight straightening of the normal cervical lordosis. Skull base and vertebrae: Visualized skull base is intact. No atlanto-occipital dissociation. The vertebral body heights are well maintained. No fracture or pathologic osseous lesion seen. Soft tissues and spinal canal: The visualized paraspinal soft tissues are unremarkable. No prevertebral soft tissue swelling is seen. The spinal canal is grossly unremarkable, no large epidural collection or significant canal narrowing. Disc levels: There is disc osteophyte complex and uncovertebral osteophytes most notable at C5-C6 and C6-C7 as on the recent prior exam. Upper chest: There is a partially visualized intra-articular comminuted fracture of the distal clavicle at the sternoclavicular joint. Other: Scattered vascular calcifications are seen. IMPRESSION: 1. No acute intracranial abnormality. 2. Findings consistent with age related atrophy and chronic small vessel ischemia 3.  No acute fracture or malalignment of the spine. 4. Subacute partially visualized distal clavicle fracture at the sternoclavicular joint which was seen on prior exam August 10, 2018. These results were called by telephone at the time of interpretation on 09/16/2018 at 8:41 pm to Dr. Dorothea GlassmanPAUL MALINDA , who verbally acknowledged these results. Electronically Signed    By: Jonna ClarkBindu  Avutu M.D.   On: 09/16/2018 20:41   Mr Abdomen W Or Wo Contrast  Result Date: 09/17/2018 CLINICAL DATA:  Cystic mass in pancreatic tail on recent ultrasound. Weight loss. EXAM: MRI ABDOMEN WITHOUT AND WITH CONTRAST TECHNIQUE: Multiplanar multisequence MR imaging of the abdomen  was performed both before and after the administration of intravenous contrast. CONTRAST:  7 mL Gadavist COMPARISON:  Ultrasound on 07/26/2018 FINDINGS: Lower chest: Tiny bilateral pleural effusions. Bibasilar T2 hyperintense interstitial prominence suspicious for mild interstitial edema. Hepatobiliary: Exam is technically suboptimal due to patient motion and nonstandard dynamic postcontrast imaging. Diffuse hepatic steatosis. No hepatic masses identified. Enlarged caudate lobe and perihepatic venous collaterals are suspicious for cirrhosis in portal venous hypertension. Mild ascites noted. Gallbladder is unremarkable. Borderline dilatation of common bile duct is seen measuring 6 mm. A meniscus sign is seen at the distal common bile duct, and a distal common bile duct stone cannot be excluded. Pancreas: Exam is technically suboptimal due to patient motion and nonstandard dynamic postcontrast imaging. The pancreas is enlarged. 4 complex lesions are seen which show at least peripheral rim enhancement, although evaluation is limited by lack of conventional precontrast and subtraction imaging. These are located in the pancreatic head measuring 2.5 x 2.2 cm, the superior aspect of the pancreatic body adjacent to the celiac axis measuring 2.5 x 1.9 cm, and the pancreatic tail measuring 4.5 x 3.4 cm and 2.4 x 2.2 cm. There appears to be thrombosis of the splenic vein. This may represent pancreatitis complicated by pseudocysts, however intraductal papillary mucinous neoplasm or other pancreatic malignancy cannot be excluded. Spleen: Within normal limits in size and appearance, although splenic vein thrombosis is noted. Adrenals/Urinary  Tract: No masses identified. No evidence of hydronephrosis. Stomach/Bowel: Visualized portion unremarkable. Vascular/Lymphatic: Question 2.5 cm celiac axis lymph node versus lesion arising from the superior pancreatic body. Other:  None. Musculoskeletal:  No suspicious bone lesions identified. IMPRESSION: Technically suboptimal exam. Enlarged pancreas with 4 complex lesions measuring up to 4.5 cm. Differential diagnosis includes pancreatitis complicated by pseudocysts, and intraductal papillary mucinous neoplasm or other pancreatic malignancy. Consider further evaluation with triple phase pancreatic protocol abdomen CT without and with contrast. Question enlarged celiac axis lymph node versus lesion arising from the superior margin of the pancreatic body. Borderline biliary ductal dilatation with meniscus sign at the ampulla, which could represent a distal common bile duct stone. Diffuse hepatic steatosis and possible cirrhosis. 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 Mild ascites. Electronically Signed   By: Danae OrleansJohn A Stahl M.D.   On: 09/17/2018 05:13   Dg Chest Portable 1 View  Result Date: 09/16/2018 CLINICAL DATA:  Shortness of breath, edema EXAM: PORTABLE CHEST 1 VIEW COMPARISON:  08/10/2018 FINDINGS: Cardiomegaly. Coarsened interstitial prominence throughout the lungs is similar prior study, likely chronic interstitial lung disease. Low volumes. No confluent opacities, effusions or overt edema. No acute bony abnormality. IMPRESSION: Mild cardiomegaly. Chronic interstitial prominence likely reflects chronic interstitial lung disease. Electronically Signed   By: Charlett NoseKevin  Dover M.D.   On: 09/16/2018 20:11    ASSESSMENT AND PLAN:   Principal Problem:   AKI (acute kidney injury) (HCC) Active Problems:   HTN (hypertension)   CHF (congestive heart failure) (HCC)   HLD (hyperlipidemia)   Pancreas cyst   Acute renal failure (ARF) (HCC)  * AKI (acute kidney injury) (HCC) -patient does need some  diuresis      Kidney function improved some after having diuretics -came to 1.1.   *Acute CHF (congestive heart failure) (HCC) -no prior diagnosis, unspecified chronicity, unspecified type.   Ordered echocardiogram.  Awaited result.  *  Pancreas cyst -seen on ultrasound a couple of months ago, MRI abdomen shows cyst versus mass and further work-up advised. I spoke to patient and her daughter on phone and advised him to  follow-up with cancer center within the next 2 weeks to have further work-up scheduled. I will contact her oncologist to make this appointment and also spoke to his daughter on phone regarding the need for this.  *Hypokalemia Due to diuresis Check magnesium. Replace potassium IV and oral also.  Recheck potassium today afternoon. Keep on telemetry monitoring until potassium is less than 3.  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  He was supposed to follow with urologist as outpatient to have a biopsy scheduled but he did not go until now. I encouraged patient and also spoke to his daughter on phone and advised him to have this follow-up appointments done within the next few weeks.     All the records are reviewed and case discussed with Care Management/Social Workerr. Management plans discussed with the patient, family and they are in agreement.  CODE STATUS: full.  TOTAL TIME TAKING CARE OF THIS PATIENT: 35 minutes.     POSSIBLE D/C IN 1-2 DAYS, DEPENDING ON CLINICAL CONDITION.   Altamese Dilling M.D on 09/18/2018   Between 7am to 6pm - Pager - [phone]  After 6pm go to [email or web address] - password [employer]  [facility]  Office  [phone]  CC: Primary care physician; Jaclyn Shaggy, MD  Note: This dictation was prepared with Dragon dictation along with smaller phrase technology. Any  transcriptional errors that result from this process are unintentional.

## 2018-09-19 LAB — ECHOCARDIOGRAM COMPLETE
Height: 70 in
Weight: 2512 oz

## 2018-09-19 LAB — BASIC METABOLIC PANEL
Anion gap: 11 (ref 5–15)
BUN: 19 mg/dL (ref 8–23)
CO2: 27 mmol/L (ref 22–32)
Calcium: 7.9 mg/dL — ABNORMAL LOW (ref 8.9–10.3)
Chloride: 100 mmol/L (ref 98–111)
Creatinine, Ser: 1.09 mg/dL (ref 0.61–1.24)
GFR calc Af Amer: >60 mL/min (ref >60)
GFR calc non Af Amer: >60 mL/min (ref >60)
Glucose, Bld: 120 mg/dL — ABNORMAL HIGH (ref 70–99)
Potassium: 3.5 mmol/L (ref 3.5–5.1)
Sodium: 138 mmol/L (ref 135–145)

## 2018-09-19 LAB — MAGNESIUM: Magnesium: 2 mg/dL (ref 1.7–2.4)

## 2018-09-19 MED ORDER — POTASSIUM CHLORIDE CRYS ER 20 MEQ PO TBCR
40.0000 meq | EXTENDED_RELEASE_TABLET | Freq: Once | ORAL | Status: AC
  Administered 2018-09-19: 40 meq via ORAL
  Filled 2018-09-19: qty 2

## 2018-09-19 MED ORDER — FUROSEMIDE 20 MG PO TABS
20.0000 mg | ORAL_TABLET | Freq: Once | ORAL | Status: AC
  Administered 2018-09-19: 08:00:00 20 mg via ORAL
  Filled 2018-09-19: qty 1

## 2018-09-19 NOTE — Evaluation (Signed)
Physical Therapy Evaluation Patient Details Name: Shane Lindsey MRN: [id number] DOB: [birth year] Today's Date: 09/19/2018   History of Present Illness  Pt is an 83 y.o. male presenting to hospital 09/16/18 with trouble sleeping, LE's stiff and swollen, and weakness.  Pt admitted with AKI, acute CHF, pancreas cyst, and prostate mass.  PMH includes htn and HLD.  Clinical Impression  Prior to hospital admission, pt reports most recently using RW for ambulation (no AD use prior to that).  Pt lives with his spouse (pt reports being caregiver for his wife but family also lives nearby and is very supportive and assist a lot).  Currently pt is CGA progressing to SBA with transfers and ambulation 250 feet with RW.  Trialed pt with no AD ambulating x10 feet but pt unsteady so used RW instead.  Pt demonstrated decreased cadence and decreased B LE step length with ambulation but overall steady without any loss of balance during session's activities using RW.  Pt reports he normally stretches his legs before getting up d/t stiffness and pt performed his own stretches (sitting in recliner) prior to standing to ambulate.  Pt would benefit from skilled PT to address noted impairments and functional limitations (see below for any additional details).  Upon hospital discharge, recommend pt discharge with HHPT.  Pt reports he has assist available for stairs to enter/exit home.    Follow Up Recommendations Home health PT;Other (comment)(assist for stairs)    Equipment Recommendations  Rolling walker with 5" wheels    Recommendations for Other Services       Precautions / Restrictions Precautions Precautions: Fall Restrictions Weight Bearing Restrictions: No      Mobility  Bed Mobility               General bed mobility comments: Deferred (pt up in chair at beginning/end of session)  Transfers Overall transfer level: Needs assistance Equipment used: Rolling walker (2 wheeled) Transfers: Sit to/from  Stand Sit to Stand: Min guard;Supervision         General transfer comment: initial vc's for UE placement to assist with standing (CGA provided) but then pt SBA next 2 trials standing from recliner during session with use of RW  Ambulation/Gait Ambulation/Gait assistance: Min guard;Supervision Gait Distance (Feet): 250 Feet Assistive device: Rolling walker (2 wheeled)   Gait velocity: steady   General Gait Details: decreased B LE step length  Stairs Stairs: Yes Stairs assistance: Min guard Stair Management: One rail Right Number of Stairs: 2 General stair comments: increased effort to step up but steady; good controlled descent down steps  Wheelchair Mobility    Modified Rankin (Stroke Patients Only)       Balance Overall balance assessment: Needs assistance Sitting-balance support: No upper extremity supported;Feet supported Sitting balance-Leahy Scale: Normal Sitting balance - Comments: steady sitting reaching outside BOS   Standing balance support: No upper extremity supported Standing balance-Leahy Scale: Good Standing balance comment: steady standing reaching within BOS                             Pertinent Vitals/Pain Pain Assessment: No/denies pain  Vitals (HR and O2 on room air) stable and WFL throughout treatment session.    Home Living Family/patient expects to be discharged to:: Private residence Living Arrangements: Spouse/significant other Available Help at Discharge: Family Type of Home: House Home Access: Stairs to enter   [employer] of Steps: 1 step with hand holds Home Layout: Two level;Able  to live on main level with bedroom/bathroom Home Equipment: Dan HumphreysWalker - 2 wheels      Prior Function Level of Independence: Independent with assistive device(s)         Comments: Ambulatory with RW most recently.  3 falls in past 6 months (1 fall last week).  Pt reports being primary caregiver for his spouse who has Parkinson's  (assists pt walking to bathroom) but has supportive family that also assists pt and pt's wife.  3 children live nearby and assist.     Hand Dominance        Extremity/Trunk Assessment   Upper Extremity Assessment Upper Extremity Assessment: Generalized weakness    Lower Extremity Assessment Lower Extremity Assessment: Generalized weakness    Cervical / Trunk Assessment Cervical / Trunk Assessment: (forward head)  Communication   Communication: No difficulties  Cognition Arousal/Alertness: Awake/alert Behavior During Therapy: WFL for tasks assessed/performed Overall Cognitive Status: Within Functional Limits for tasks assessed                                        General Comments   Nursing cleared pt for participation in physical therapy.  Pt agreeable to PT session.  Pt's son present end of session.    Exercises  Gait training with RW use (vc's for safe use)   Assessment/Plan    PT Assessment Patient needs continued PT services  PT Problem List Decreased strength;Decreased balance;Decreased mobility;Decreased knowledge of use of DME       PT Treatment Interventions DME instruction;Gait training;Stair training;Functional mobility training;Therapeutic activities;Therapeutic exercise;Balance training;Patient/family education    PT Goals (Current goals can be found in the Care Plan section)  Acute Rehab PT Goals Patient Stated Goal: to go home PT Goal Formulation: With patient Time For Goal Achievement: 10/03/18 Potential to Achieve Goals: Good    Frequency Min 2X/week   Barriers to discharge        Co-evaluation               AM-PAC PT "6 Clicks" Mobility  Outcome Measure Help needed turning from your back to your side while in a flat bed without using bedrails?: A Little Help needed moving from lying on your back to sitting on the side of a flat bed without using bedrails?: A Little Help needed moving to and from a bed to a chair  (including a wheelchair)?: A Little Help needed standing up from a chair using your arms (e.g., wheelchair or bedside chair)?: A Little Help needed to walk in hospital room?: A Little Help needed climbing 3-5 steps with a railing? : A Little 6 Click Score: 18    End of Session Equipment Utilized During Treatment: Gait belt Activity Tolerance: Patient tolerated treatment well Patient left: in chair;with call bell/phone within reach;with chair alarm set;with family/visitor present Nurse Communication: Mobility status;Precautions PT Visit Diagnosis: Unsteadiness on feet (R26.81);Muscle weakness (generalized) (M62.81);History of falling (Z91.81);Other abnormalities of gait and mobility (R26.89)    Time: [id number] PT Time Calculation (min) (ACUTE ONLY): 29 min   Charges:   PT Evaluation $PT Eval Low Complexity: 1 Low PT Treatments $Gait Training: 8-22 mins       Hendricks LimesEmily Bethene Hankinson, PT 09/19/18, 3:31 PM [phone]

## 2018-09-19 NOTE — Progress Notes (Signed)
PHARMACY CONSULT NOTE - FOLLOW UP  Pharmacy Consult for Electrolyte Monitoring and Replacement   Recent Labs: Potassium (mmol/L)  Date Value  09/19/2018 3.5   Magnesium (mg/dL)  Date Value  09/19/2018 2.0   Calcium (mg/dL)  Date Value  09/19/2018 7.9 (L)   Albumin (g/dL)  Date Value  08/10/2018 2.5 (L)   Sodium (mmol/L)  Date Value  09/19/2018 138     Assessment: Patient does not need electrolyte replacement as they are WNL.    Goal of Therapy:  Electrolytes WNL's  Plan:  No replacement at this time. Will follow electrolytes with AM labs.    Rowland Lathe ,PharmD Clinical Pharmacist 09/19/2018 11:45 AM

## 2018-09-19 NOTE — Progress Notes (Signed)
Pt helped to [facility].  No BM.  Assisted to recliner with chair alarm set, tray table in front of pt and pt given instructions to use the call light if he needed anything or to get up. Dorna Bloom RN

## 2018-09-19 NOTE — Progress Notes (Signed)
Pt woke up around 4 AM and has since been trying to get out of bed, not following commands, and uncooperative.  Pt wants to stand up.  Helped to stand with 2 assist.  Pt is unsteady, lacking judgement.  Requested pt lay back in bed.  Explained to pt that he appears to be having some confusion possibly from the sleeping pill and recommend that he lay back in bed and try to rest further. Pt assisted back in bed and alarm set to high setting. Left door open to observe pt.  Potassium run is close to being completed.  Had to run slower because of burning at IV site.  Pt did not want to use the ice pack to the IV site to help with the burning. Dorna Bloom RN

## 2018-09-19 NOTE — TOC Transition Note (Signed)
Transition of Care [facility]) - CM/SW Discharge Note   Patient Details  Name: Shane Lindsey MRN: [id number] Date of Birth: 1935/02/10  [facility] CM/SW Contact:  Shelbie Hutching, RN Phone Number: 09/19/2018, 1:35 PM   Clinical Narrative:    Patient ready for discharge today.  Home health orders are in, [facility] Fl[facility] the [facility].     Final next level of care: [place] Barriers to Discharge: Barriers Resolved   Patient Goals and CMS Choice   CMS Medicare.gov Compare Post Acute Care list provided to:: Patient Choice offered to / list presented to : Patient, Spouse  Discharge Placement                       Discharge Plan and Services   Discharge Planning Services: CM Consult Post Acute Care Choice: Home Health                    HH Arranged: RN, PT, Nurse's Aide, Social Work [employer] Agency: [place]) Date [place]: 09/19/18 Time [place]: 1335 Representative spoke with at [place]: [place] (SDOH) Interventions     Readmission Risk Interventions No flowsheet data found.

## 2018-09-19 NOTE — Discharge Summary (Signed)
[facility] [place] NAME: Shane Lindsey    MR#:  [id number]  DATE OF BIRTH:  [birth year]  DATE OF ADMISSION:  09/16/2018   ADMITTING PHYSICIAN: Lance Coon, MD  DATE OF DISCHARGE: 09/19/2018  PRIMARY CARE PHYSICIAN: Albina Billet, MD   ADMISSION DIAGNOSIS:  Weakness [R53.1] Elevated troponin [R79.89] AKI (acute kidney injury) ([place]) [N17.9] Congestive heart failure, unspecified HF chronicity, unspecified heart failure type ([place]) [I50.9] DISCHARGE DIAGNOSIS:  Principal Problem:   AKI (acute kidney injury) ([place]) Active Problems:   HTN (hypertension)   CHF (congestive heart failure) ([facility])   HLD (hyperlipidemia)   Pancreas cyst   Acute renal failure (ARF) ([place])  SECONDARY DIAGNOSIS:   Past Medical History:  Diagnosis Date  . Hyperlipemia   . Hypertension    HOSPITAL COURSE:  *AKI (acute kidney injury) ([place]) -patient does need some diuresis      Kidney function improved some after having diuretics -came to 1.1.  *AcuteCHF (congestive heart failure) ([facility]) -no prior diagnosis, unspecified chronicity, unspecified type.  Echocardiogram report is still pending.  Continue Lasix, follow-up cardiologist as outpatient.  *Pancreas cyst -seen on ultrasound a couple of months ago, MRI abdomen shows cyst versus mass and further work-up advised. He is advised him to follow-up with cancer center within the next 2 weeks to have further work-up scheduled.  Follow-up Dr. Janese Banks.  *Hypokalemia Due to diuresis.  Improved with supplement. Hypomagnesemia.  Improved with supplement.  *HTN (hypertension) -continue Lasix, hold lisinopril due to renal failure.  BP is normal.  Follow-up cardiologist to resume.  *HLD (hyperlipidemia) -continue home Lipitor.  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  He was supposed to follow with urologist as outpatient to have a biopsy scheduled but he did not go  until now.  I advised patient to follow-up with Dr. Eliberto Ivory in clinic.  DISCHARGE CONDITIONS:  Stable, discharge to home today. CONSULTS OBTAINED:   DRUG ALLERGIES:  No Known Allergies DISCHARGE MEDICATIONS:   Allergies as of 09/19/2018   No Known Allergies     Medication List    STOP taking these medications   lisinopril 30 MG tablet Commonly known as: ZESTRIL     TAKE these medications   atorvastatin 10 MG tablet Commonly known as: LIPITOR Take 10 mg by mouth daily.   furosemide 20 MG tablet Commonly known as: LASIX Take 20 mg by mouth daily.   SYSTANE OP Place 1 drop into both eyes daily as needed (dry eyes).   tamsulosin 0.4 MG Caps capsule Commonly known as: FLOMAX Take 0.4 mg by mouth daily.   traMADol 50 MG tablet Commonly known as: Ultram Take 1 tablet (50 mg total) by mouth every 6 (six) hours as needed for moderate pain.        DISCHARGE INSTRUCTIONS:  See AVS.  If you experience worsening of your admission symptoms, develop shortness of breath, life threatening emergency, suicidal or homicidal thoughts you must seek medical attention immediately by calling 911 or calling your MD immediately  if symptoms less severe.  You Must read complete instructions/literature along with all the possible adverse reactions/side effects for all the Medicines you take and that have been prescribed to you. Take any new Medicines after you have completely understood and accpet all the possible adverse reactions/side effects.   Please note  You were cared for by a hospitalist during your hospital stay. If you have any questions about  your discharge medications or the care you received while you were in the hospital after you are discharged, you can call the unit and asked to speak with the hospitalist on call if the hospitalist that took care of you is not available. Once you are discharged, your primary care physician will handle any further medical issues. Please note that  NO REFILLS for any discharge medications will be authorized once you are discharged, as it is imperative that you return to your primary care physician (or establish a relationship with a primary care physician if you do not have one) for your aftercare needs so that they can reassess your need for medications and monitor your lab values.    On the day of Discharge:  VITAL SIGNS:  Blood pressure 120/64, pulse 77, temperature 98.1 F (36.7 C), temperature source Oral, resp. rate 17, height 5\' 10"  (1.778 m), weight 71.2 kg, SpO2 100 %. PHYSICAL EXAMINATION:  GENERAL:  83 y.o.-year-old patient lying in the bed with no acute distress.  EYES: Pupils equal, round, reactive to light and accommodation. No scleral icterus. Extraocular muscles intact.  HEENT: Head atraumatic, normocephalic. Oropharynx and nasopharynx clear.  NECK:  Supple, no jugular venous distention. No thyroid enlargement, no tenderness.  LUNGS: Normal breath sounds bilaterally, no wheezing, rales,rhonchi or crepitation. No use of accessory muscles of respiration.  CARDIOVASCULAR: S1, S2 normal. No murmurs, rubs, or gallops.  ABDOMEN: Soft, non-tender, non-distended. Bowel sounds present. No organomegaly or mass.  EXTREMITIES: No pedal edema, cyanosis, or clubbing.  NEUROLOGIC: Cranial nerves II through XII are intact. Muscle strength 5/5 in all extremities. Sensation intact. Gait not checked.  PSYCHIATRIC: The patient is alert and oriented x 3.  SKIN: No obvious rash, lesion, or ulcer.  DATA REVIEW:   CBC Recent Labs  Lab 09/17/18 0136  WBC 9.1  HGB 10.6*  HCT 29.9*  PLT 148*    Chemistries  Recent Labs  Lab 09/19/18 0634  NA 138  K 3.5  CL 100  CO2 27  GLUCOSE 120*  BUN 19  CREATININE 1.09  CALCIUM 7.9*  MG 2.0     Microbiology Results  Results for orders placed or performed during the hospital encounter of 09/16/18  SARS CORONAVIRUS 2 (TAT 6-24 HRS) Nasopharyngeal Nasopharyngeal Swab     Status: None    Collection Time: 09/16/18  8:17 PM   Specimen: Nasopharyngeal Swab  Result Value Ref Range Status   SARS Coronavirus 2 NEGATIVE NEGATIVE Final    Comment: (NOTE) SARS-CoV-2 target nucleic acids are NOT DETECTED. The SARS-CoV-2 RNA is generally detectable in upper and lower respiratory specimens during the acute phase of infection. Negative results do not preclude SARS-CoV-2 infection, do not rule out co-infections with other pathogens, and should not be used as the sole basis for treatment or other patient management decisions. Negative results must be combined with clinical observations, patient history, and epidemiological information. The expected result is Negative. Fact Sheet for Patients: HairSlick.no Fact Sheet for Healthcare Providers: quierodirigir.com This test is not yet approved or cleared by the [place] FDA and  has been authorized for detection and/or diagnosis of SARS-CoV-2 by FDA under an Emergency Use Authorization (EUA). This EUA will remain  in effect (meaning this test can be used) for the duration of the COVID-19 declaration under Section 56 4(b)(1) of the Act, 21 U.S.C. section 360bbb-3(b)(1), unless the authorization is terminated or revoked sooner. Performed at [facility], [address] [place], [place] [address]  RADIOLOGY:  No results found.   Management plans discussed with the patient, family and they are in agreement.  CODE STATUS: Full Code   TOTAL TIME TAKING CARE OF THIS PATIENT: 35 minutes.    Shaune PollackQing Tkai Large M.D on 09/19/2018 at 12:18 PM  Between 7am to 6pm - Pager - [phone]  After 6pm go to [email or web address] - Social research officer, governmentpassword [email or web address] [email or web address]  [facility]  Office  [phone]  CC: Primary care physician; Jaclyn Shaggyate, Denny C, MD   Note: This dictation was prepared with Dragon dictation along with smaller phrase technology. Any transcriptional errors that  result from this process are unintentional.

## 2018-09-25 NOTE — Progress Notes (Deleted)
   Patient ID: Shane Lindsey, male    DOB: 08/13/1935, 83 y.o.   MRN: [id number]  HPI  Shane Lindsey is a 83 y/o male with a history of  Echo report from 09/17/2018 reviewed and showed an EF of 60-65%.   Admitted 09/16/2018 due to acute heart failure. Initially needed IV lasix and then transitioned to oral diuretics. Potassium supplemented due to hypokalemia. Discharged after 3 days.   He presents today for his initial visit with a chief complaint of   Review of Systems    Physical Exam    Assessment & Plan:  1: Chronic heart failure with preserved ejection fraction- - NYHA class - BNP 09/16/2018 was 226.0  2: HTN- - BP - BMP 09/19/2018 reviewed and showed sodium 138, potassium 3.5, creatinine 1.09 and GFR >60  3: Prostate mass- - to follow-up with urology

## 2018-09-26 ENCOUNTER — Other Ambulatory Visit: Payer: Self-pay

## 2018-09-26 ENCOUNTER — Ambulatory Visit: Payer: Medicare Other | Admitting: Family

## 2018-09-26 ENCOUNTER — Encounter: Payer: Self-pay | Admitting: Oncology

## 2018-09-26 ENCOUNTER — Inpatient Hospital Stay: Payer: Medicare Other

## 2018-09-26 ENCOUNTER — Inpatient Hospital Stay: Payer: Medicare Other | Attending: Oncology | Admitting: Oncology

## 2018-09-26 VITALS — BP 102/65 | HR 97 | Temp 99.0°F | Resp 16 | Wt 157.4 lb

## 2018-09-26 DIAGNOSIS — K869 Disease of pancreas, unspecified: Secondary | ICD-10-CM | POA: Diagnosis not present

## 2018-09-26 DIAGNOSIS — Z841 Family history of disorders of kidney and ureter: Secondary | ICD-10-CM | POA: Diagnosis not present

## 2018-09-26 DIAGNOSIS — K8689 Other specified diseases of pancreas: Secondary | ICD-10-CM

## 2018-09-26 DIAGNOSIS — E785 Hyperlipidemia, unspecified: Secondary | ICD-10-CM | POA: Diagnosis not present

## 2018-09-26 DIAGNOSIS — K760 Fatty (change of) liver, not elsewhere classified: Secondary | ICD-10-CM

## 2018-09-26 DIAGNOSIS — Z808 Family history of malignant neoplasm of other organs or systems: Secondary | ICD-10-CM | POA: Diagnosis not present

## 2018-09-26 DIAGNOSIS — R188 Other ascites: Secondary | ICD-10-CM

## 2018-09-26 DIAGNOSIS — Z79899 Other long term (current) drug therapy: Secondary | ICD-10-CM | POA: Diagnosis not present

## 2018-09-26 DIAGNOSIS — D539 Nutritional anemia, unspecified: Secondary | ICD-10-CM

## 2018-09-26 DIAGNOSIS — R918 Other nonspecific abnormal finding of lung field: Secondary | ICD-10-CM | POA: Diagnosis not present

## 2018-09-26 DIAGNOSIS — K8050 Calculus of bile duct without cholangitis or cholecystitis without obstruction: Secondary | ICD-10-CM

## 2018-09-26 DIAGNOSIS — R5383 Other fatigue: Secondary | ICD-10-CM

## 2018-09-26 DIAGNOSIS — R634 Abnormal weight loss: Secondary | ICD-10-CM | POA: Diagnosis not present

## 2018-09-26 DIAGNOSIS — R0602 Shortness of breath: Secondary | ICD-10-CM | POA: Diagnosis not present

## 2018-09-26 DIAGNOSIS — K862 Cyst of pancreas: Secondary | ICD-10-CM | POA: Diagnosis not present

## 2018-09-26 DIAGNOSIS — N179 Acute kidney failure, unspecified: Secondary | ICD-10-CM

## 2018-09-26 DIAGNOSIS — R531 Weakness: Secondary | ICD-10-CM | POA: Diagnosis not present

## 2018-09-26 DIAGNOSIS — I110 Hypertensive heart disease with heart failure: Secondary | ICD-10-CM

## 2018-09-26 DIAGNOSIS — I82890 Acute embolism and thrombosis of other specified veins: Secondary | ICD-10-CM | POA: Diagnosis not present

## 2018-09-26 DIAGNOSIS — M2578 Osteophyte, vertebrae: Secondary | ICD-10-CM | POA: Diagnosis not present

## 2018-09-26 DIAGNOSIS — Z809 Family history of malignant neoplasm, unspecified: Secondary | ICD-10-CM

## 2018-09-26 NOTE — Progress Notes (Signed)
Pt new today with pancreatic cysts. Pt has pain in thighs, and calves only when standing or walking. He has no appetite and per the pt and the daughters they feel that he has lost about 50 lbs since April. 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 The daughters, son, in laws has been checking on family and bringing meals for the pt and wife to eat. Pt states that he has fallen several times.  Pt. States that his dad died of cancer- he thought it was lung cancer and daughter thought it was lymphoma. Has a sister that had brain mets but neither could think what primary was. Brother with kidney disease -on dialysis

## 2018-09-27 LAB — CANCER ANTIGEN 19-9: CA 19-9: 57 U/mL — ABNORMAL HIGH (ref 0–35)

## 2018-09-28 ENCOUNTER — Encounter: Payer: Self-pay | Admitting: Oncology

## 2018-09-28 NOTE — Progress Notes (Signed)
Hematology/Oncology Consult note [facility] [phone] [phone]  Patient Care Team: Albina Billet, MD as PCP - General (Internal Medicine) Clent Jacks, RN as Oncology Nurse Navigator   Name of the patient: Shane Lindsey  [id number]  [birth year]    Reason for referral- abnormal CT abdomen   Referring physician- Dr. Bridgett Larsson  Date of visit: 09/28/18   History of presenting illness-patient is a 83 year old male with a past medical history significant for hypertension hyperlipidemia who was recently admitted the hospital For generalized weakness and acute kidney injury.  He received IV diuretics and was discharged home eventually.  He was thought to possibly have heart failure although his echocardiogram was normal.  He is yet to see a cardiologist.  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  MRI showed 4 complex lesions in the pancreas involving the head body as well as pancreatic tail.  He has been referred to [place] for further management.  Patient has also been noted to have abnormalProstate enhancement based on his MRI prostate in May 2020 and follows up with Dr. Eliberto Ivory for the same.  Patient is here with his 2 daughters.  He has been the caregiver for his wife who has Parkinson's all these years.  However patient himself has been declining since March 2020.  Prior to that he was independent of his ADLs and IADLs as well as driving.  However since March she has slowed down and has been requiring increasing assistance.  His appetite is poor and he has lost about 50 pounds of weight in the last 6 months.  Denies any abdominal pain.  Denies any shortness of breath  ECOG PS- 2  Pain scale- 0   Review of systems- Review of Systems  Constitutional: Positive for malaise/fatigue and weight loss. Negative for chills and fever.  HENT: Negative  for congestion, ear discharge and nosebleeds.   Eyes: Negative for blurred vision.  Respiratory: Negative for cough, hemoptysis, sputum production, shortness of breath and wheezing.   Cardiovascular: Negative for chest pain, palpitations, orthopnea and claudication.  Gastrointestinal: Negative for abdominal pain, blood in stool, constipation, diarrhea, heartburn, melena, nausea and vomiting.  Genitourinary: Negative for dysuria, flank pain, frequency, hematuria and urgency.  Musculoskeletal: Negative for back pain, joint pain and myalgias.  Skin: Negative for rash.  Neurological: Negative for dizziness, tingling, focal weakness, seizures, weakness and headaches.  Endo/Heme/Allergies: Does not bruise/bleed easily.  Psychiatric/Behavioral: Negative for depression and suicidal ideas. The patient does not have insomnia.     No Known Allergies  Patient Active Problem List   Diagnosis Date Noted   Acute renal failure (ARF) ([place]) 09/17/2018   HTN (hypertension) 09/16/2018   AKI (acute kidney injury) ([place]) 09/16/2018   CHF (congestive heart failure) ([place]) 09/16/2018   HLD (hyperlipidemia) 09/16/2018   Pancreas cyst 09/16/2018     Past Medical History:  Diagnosis Date   Hyperlipemia    Hypertension    Pancreatic cyst      Past Surgical History:  Procedure Laterality Date   TONSILLECTOMY      Social History   Socioeconomic History   Marital status: Married    Spouse name: Not on file   Number of children: Not on file   Years of education: Not on file   Highest education level: Not on file  Occupational History   Not on file  Social Network [employer]   Financial resource strain: Not hard at all   Food insecurity    Worry: Never true    Inability: Never true   Transportation needs    Medical: No    Non-medical: No  Tobacco Use   Smoking status: Never Smoker   Smokeless tobacco: Never Used  Substance and Sexual Activity   Alcohol use: Yes    Comment: Rum 2 oz  a day   Drug use: No   Sexual activity: Not Currently  Lifestyle   Physical activity    Days per week: 0 days    Minutes per session: 0 min   Stress: Not at all  Relationships   Social connections    Talks on phone: Once a week    Gets together: Once a week    Attends religious service: Never    Active member of club or organization: No    Attends meetings of clubs or organizations: Never    Relationship status: Married   Intimate partner violence    Fear of current or ex partner: No    Emotionally abused: No    Physically abused: No    Forced sexual activity: No  Other Topics Concern   Not on file  Social History Narrative   Lives with wife, walks with walker.     Family History  Problem Relation Age of Onset   Cancer Father    Brain cancer Sister    Kidney failure Brother      Current Outpatient Medications:    atorvastatin (LIPITOR) 10 MG tablet, Take 10 mg by mouth daily., Disp: , Rfl:    furosemide (LASIX) 20 MG tablet, Take 20 mg by mouth daily. , Disp: , Rfl:    Polyethyl Glycol-Propyl Glycol (SYSTANE OP), Place 1 drop into both eyes daily as needed (dry eyes)., Disp: , Rfl:    tamsulosin (FLOMAX) 0.4 MG CAPS capsule, Take 0.4 mg by mouth daily., Disp: , Rfl:    traMADol (ULTRAM) 50 MG tablet, Take 1 tablet (50 mg total) by mouth every 6 (six) hours as needed for moderate pain., Disp: 12 tablet, Rfl: 0   Physical exam:  Vitals:   09/26/18 1535  BP: 102/65  Pulse: 97  Resp: 16  Temp: 99 F (37.2 C)  TempSrc: Tympanic  Weight: 157 lb 6.4 oz (71.4 kg)   Physical Exam Constitutional:      Comments: Elderly frail gentleman sitting ina  Wheelchair. Appears fatigued  HENT:     Head: Normocephalic and atraumatic.  Eyes:     Pupils: Pupils are equal, round, and reactive to light.  Neck:     Musculoskeletal: Normal range of motion.  Cardiovascular:     Rate and Rhythm: Normal rate and regular rhythm.     Heart sounds: Normal heart sounds.    Pulmonary:     Effort: Pulmonary effort is normal.     Breath sounds: Normal breath sounds.  Abdominal:     General: Bowel sounds are normal.     Palpations: Abdomen is soft.  Musculoskeletal:     Comments: Trace b/l edema  Skin:    General: Skin is warm and dry.  Neurological:     Mental Status: He is alert and oriented to person, place, and time.        CMP Latest Ref Rng & Units 09/19/2018  Glucose 70 - 99 mg/dL 960(A120(H)  BUN 8 - 23 mg/dL 19  Creatinine 5.400.61 - 9.811.24 mg/dL 1.911.09  Sodium 478135 -  145 mmol/L 138  Potassium 3.5 - 5.1 mmol/L 3.5  Chloride 98 - 111 mmol/L 100  CO2 22 - 32 mmol/L 27  Calcium 8.9 - 10.3 mg/dL 7.9(L)  Total Protein 6.5 - 8.1 g/dL -  Total Bilirubin 0.3 - 1.2 mg/dL -  Alkaline Phos 38 - 161126 U/L -  AST 15 - 41 U/L -  ALT 0 - 44 U/L -   CBC Latest Ref Rng & Units 09/17/2018  WBC 4.0 - 10.5 K/uL 9.1  Hemoglobin 13.0 - 17.0 g/dL 10.6(L)  Hematocrit 39.0 - 52.0 % 29.9(L)  Platelets 150 - 400 K/uL 148(L)    No images are attached to the encounter.  Ct Head Wo Contrast  Result Date: 09/16/2018 CLINICAL DATA:  Head trauma, found down and difficulty walking EXAM: CT HEAD WITHOUT CONTRAST CT CERVICAL SPINE WITHOUT CONTRAST TECHNIQUE: Multidetector CT imaging of the head and cervical spine was performed following the standard protocol without intravenous contrast. Multiplanar CT image reconstructions of the cervical spine were also generated. COMPARISON:  August 10, 2018 FINDINGS: CT HEAD FINDINGS Brain: No evidence of acute territorial infarction, hemorrhage, hydrocephalus,extra-axial collection or mass lesion/mass effect. There is dilatation the ventricles and sulci consistent with age-related atrophy. Low-attenuation changes in the deep white matter consistent with small vessel ischemia. Vascular: No hyperdense vessel or unexpected calcification. Skull: The skull is intact. No fracture or focal lesion identified. Sinuses/Orbits: There is fluid seen within the right  maxillary sinus. The orbits and globes intact. Other: None CT CERVICAL SPINE FINDINGS Alignment: There is slight straightening of the normal cervical lordosis. Skull base and vertebrae: Visualized skull base is intact. No atlanto-occipital dissociation. The vertebral body heights are well maintained. No fracture or pathologic osseous lesion seen. Soft tissues and spinal canal: The visualized paraspinal soft tissues are unremarkable. No prevertebral soft tissue swelling is seen. The spinal canal is grossly unremarkable, no large epidural collection or significant canal narrowing. Disc levels: There is disc osteophyte complex and uncovertebral osteophytes most notable at C5-C6 and C6-C7 as on the recent prior exam. Upper chest: There is a partially visualized intra-articular comminuted fracture of the distal clavicle at the sternoclavicular joint. Other: Scattered vascular calcifications are seen. IMPRESSION: 1. No acute intracranial abnormality. 2. Findings consistent with age related atrophy and chronic small vessel ischemia 3.  No acute fracture or malalignment of the spine. 4. Subacute partially visualized distal clavicle fracture at the sternoclavicular joint which was seen on prior exam August 10, 2018. These results were called by telephone at the time of interpretation on 09/16/2018 at 8:41 pm to Dr. Dorothea GlassmanPAUL MALINDA , who verbally acknowledged these results. Electronically Signed   By: Jonna ClarkBindu  Avutu M.D.   On: 09/16/2018 20:41   Ct Cervical Spine Wo Contrast  Result Date: 09/16/2018 CLINICAL DATA:  Head trauma, found down and difficulty walking EXAM: CT HEAD WITHOUT CONTRAST CT CERVICAL SPINE WITHOUT CONTRAST TECHNIQUE: Multidetector CT imaging of the head and cervical spine was performed following the standard protocol without intravenous contrast. Multiplanar CT image reconstructions of the cervical spine were also generated. COMPARISON:  August 10, 2018 FINDINGS: CT HEAD FINDINGS Brain: No evidence of acute  territorial infarction, hemorrhage, hydrocephalus,extra-axial collection or mass lesion/mass effect. There is dilatation the ventricles and sulci consistent with age-related atrophy. Low-attenuation changes in the deep white matter consistent with small vessel ischemia. Vascular: No hyperdense vessel or unexpected calcification. Skull: The skull is intact. No fracture or focal lesion identified. Sinuses/Orbits: There is fluid seen within the right maxillary sinus.  The orbits and globes intact. Other: None CT CERVICAL SPINE FINDINGS Alignment: There is slight straightening of the normal cervical lordosis. Skull base and vertebrae: Visualized skull base is intact. No atlanto-occipital dissociation. The vertebral body heights are well maintained. No fracture or pathologic osseous lesion seen. Soft tissues and spinal canal: The visualized paraspinal soft tissues are unremarkable. No prevertebral soft tissue swelling is seen. The spinal canal is grossly unremarkable, no large epidural collection or significant canal narrowing. Disc levels: There is disc osteophyte complex and uncovertebral osteophytes most notable at C5-C6 and C6-C7 as on the recent prior exam. Upper chest: There is a partially visualized intra-articular comminuted fracture of the distal clavicle at the sternoclavicular joint. Other: Scattered vascular calcifications are seen. IMPRESSION: 1. No acute intracranial abnormality. 2. Findings consistent with age related atrophy and chronic small vessel ischemia 3.  No acute fracture or malalignment of the spine. 4. Subacute partially visualized distal clavicle fracture at the sternoclavicular joint which was seen on prior exam August 10, 2018. These results were called by telephone at the time of interpretation on 09/16/2018 at 8:41 pm to Dr. Dorothea Glassman , who verbally acknowledged these results. Electronically Signed   By: Jonna Clark M.D.   On: 09/16/2018 20:41   Mr Abdomen W Or Wo Contrast  Result Date:  09/17/2018 CLINICAL DATA:  Cystic mass in pancreatic tail on recent ultrasound. Weight loss. EXAM: MRI ABDOMEN WITHOUT AND WITH CONTRAST TECHNIQUE: Multiplanar multisequence MR imaging of the abdomen was performed both before and after the administration of intravenous contrast. CONTRAST:  7 mL Gadavist COMPARISON:  Ultrasound on 07/26/2018 FINDINGS: Lower chest: Tiny bilateral pleural effusions. Bibasilar T2 hyperintense interstitial prominence suspicious for mild interstitial edema. Hepatobiliary: Exam is technically suboptimal due to patient motion and nonstandard dynamic postcontrast imaging. Diffuse hepatic steatosis. No hepatic masses identified. Enlarged caudate lobe and perihepatic venous collaterals are suspicious for cirrhosis in portal venous hypertension. Mild ascites noted. Gallbladder is unremarkable. Borderline dilatation of common bile duct is seen measuring 6 mm. A meniscus sign is seen at the distal common bile duct, and a distal common bile duct stone cannot be excluded. Pancreas: Exam is technically suboptimal due to patient motion and nonstandard dynamic postcontrast imaging. The pancreas is enlarged. 4 complex lesions are seen which show at least peripheral rim enhancement, although evaluation is limited by lack of conventional precontrast and subtraction imaging. These are located in the pancreatic head measuring 2.5 x 2.2 cm, the superior aspect of the pancreatic body adjacent to the celiac axis measuring 2.5 x 1.9 cm, and the pancreatic tail measuring 4.5 x 3.4 cm and 2.4 x 2.2 cm. There appears to be thrombosis of the splenic vein. This may represent pancreatitis complicated by pseudocysts, however intraductal papillary mucinous neoplasm or other pancreatic malignancy cannot be excluded. Spleen: Within normal limits in size and appearance, although splenic vein thrombosis is noted. Adrenals/Urinary Tract: No masses identified. No evidence of hydronephrosis. Stomach/Bowel: Visualized portion  unremarkable. Vascular/Lymphatic: Question 2.5 cm celiac axis lymph node versus lesion arising from the superior pancreatic body. Other:  None. Musculoskeletal:  No suspicious bone lesions identified. IMPRESSION: Technically suboptimal exam. Enlarged pancreas with 4 complex lesions measuring up to 4.5 cm. Differential diagnosis includes pancreatitis complicated by pseudocysts, and intraductal papillary mucinous neoplasm or other pancreatic malignancy. Consider further evaluation with triple phase pancreatic protocol abdomen CT without and with contrast. Question enlarged celiac axis lymph node versus lesion arising from the superior margin of the pancreatic body. Borderline biliary ductal dilatation  with meniscus sign at the ampulla, which could represent a distal common bile duct stone. Diffuse hepatic steatosis and possible cirrhosis. 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 Mild ascites. Electronically Signed   By: Danae Orleans M.D.   On: 09/17/2018 05:13   Dg Chest Portable 1 View  Result Date: 09/16/2018 CLINICAL DATA:  Shortness of breath, edema EXAM: PORTABLE CHEST 1 VIEW COMPARISON:  08/10/2018 FINDINGS: Cardiomegaly. Coarsened interstitial prominence throughout the lungs is similar prior study, likely chronic interstitial lung disease. Low volumes. No confluent opacities, effusions or overt edema. No acute bony abnormality. IMPRESSION: Mild cardiomegaly. Chronic interstitial prominence likely reflects chronic interstitial lung disease. Electronically Signed   By: Charlett Nose M.D.   On: 09/16/2018 20:11    Assessment and plan- Patient is a 83 y.o. male referred by Dr. Allena Katz after recent inpatient admission showed a complex pancreatic cyst  I have reviewed MRI abdomen and pelvis images independently and discussed findings with the patient and his daughters.  Patient has 4 complex appearing lesions involving the head neck as well as pancreatic body and tail.  Ideally needs EUS to take a better  look at the cyst as well as cyst aspiration to a certain of this is a premalignant or malignant condition.  I did get in touch with Dr. Chevis Pretty from advanced endoscopy at [facility].  He is willing to do an EUS in about 2 weeks time here at [facility].  However he would need medical clearance from Dr. Jae Dire or cardiology given his recent admission for possible heart failure.  Clinically he does not appear to be an active heart failure at this time.  I also discussed with patient and his family that even if eus shows precancerous lesions or pancreatic cancer-the standard of care would be surgical resection.  However given patient's age and frailty I do not feel he is a surgical candidate so the question is if he should undergo EUS echo or if he would like to continue surveillance MRIs or no follow-up at all for these complex lesions.  Patient would like to think about this.  I have also spoken to Dr. Arlana Pouch over the phone and he will be seeing the patient as well to discuss this further.  I will obtain a baseline CA-19-9 at this time  Patient has baseline macrocytic anemia and I want to get ferritin and iron studies B12 and folate TSH and myeloma panel on him today.  However he did not have a good venous access and we were not able to draw those labs today.  I will tentatively see the patient back in 1 month's time for a video visit hopefully after his EUS is done   Total face to face encounter time for this patient visit was 40 min. >50% of the time was  spent in counseling and coordination of care.     Thank you for this kind referral and the opportunity to participate in the care of this patient   Visit Diagnosis 1. Pancreas cyst   2. Macrocytic anemia   3. Pancreatic mass     Dr. Owens Shark, MD, MPH [facility] [phone] 09/28/2018 3:21 PM

## 2018-10-03 ENCOUNTER — Telehealth: Payer: Self-pay | Admitting: Primary Care

## 2018-10-03 NOTE — Telephone Encounter (Signed)
Spoke with patient's daughter Sebastian Ache, regarding Palliative services and she was in agreement with this.  I have scheduled an In-Person Palliative Consult for 10/06/18 @ 11:30 AM.

## 2018-10-04 ENCOUNTER — Telehealth: Payer: Self-pay | Admitting: *Deleted

## 2018-10-04 NOTE — Telephone Encounter (Signed)
Called pt's house and spoke to daughter. Asked if pt has had time to see whether he wants to have EUS. The daughter states that she will talk to him again. He did not want anything going down his throat. I told her that he gets medication to make him sleepy, usually it quick and if they find anything then they will do a bx. Sometimes people with have scratchy throat afterward. It is outpt procedure and most people are not there but 2-4 hours.  She will discuss with pt. And call me with his decision sometime next week

## 2018-10-05 ENCOUNTER — Ambulatory Visit: Payer: Medicare Other | Admitting: Family

## 2018-10-06 ENCOUNTER — Other Ambulatory Visit: Payer: Self-pay

## 2018-10-06 ENCOUNTER — Other Ambulatory Visit: Payer: Medicare Other | Admitting: Primary Care

## 2018-10-06 DIAGNOSIS — Z515 Encounter for palliative care: Secondary | ICD-10-CM

## 2018-10-06 NOTE — Progress Notes (Signed)
[facility] Consult Note Telephone: [phone]  Fax: [phone]   PATIENT NAME: Shane Lindsey [address] Shane [place] [address] [phone] (home)  DOB: [birth year] MRN: [id number]  PRIMARY CARE PROVIDER:   Albina Billet, MD, [address]   [place] [address] [phone]  REFERRING PROVIDER:  Albina Billet, MD [address]   [place],  [place] [address] [phone]  RESPONSIBLE PARTY:   Extended Emergency Contact Information Primary Emergency Contact: Shane, Lindsey Address: [address] [place], [place] [address] Johnnette Litter of [place] Phone: [phone] Relation: Spouse Secondary Emergency Contact: Shane, Lindsey          [place], [place] [address] Johnnette Litter of [employer] Phone: [phone] Relation: Daughter   ASSESSMENT AND RECOMMENDATIONS:   1. Advance Care Planning/Goals of Care: Goals include to maximize quality of life and symptom management.  I left a most form with the family he will discuss with his daughters and fill it out and I will upload it next visit to the [facility] system. We discussed them using it to direct medical or emergency care. His daughter states that EMS has asked for that form in the past. He may be qualify for hospice services once his home health has concluded.  2. Symptom Management:   Pain:  Management oxycodone 5 mg q 4 -6 hours but takes < 3  A day. Recommend ATC tylenol arthritis 650 mg q 8 hrs. He has indeterminate pancreatic mass and daughter does not think they will work up. Will continue to monitor for pain needs.   Falls: Has had some falls trying to get OOB. We discussed calling for help, using good foot wear and using walker. Is working with home PT currently thru [facility].    Nutrition :We discussed his poor appetite and poor intake eating a small breakfast and small volume of dinner daily. We discussed supplements such as boost or boost  breeze. He appears thin, approaching cachexia.    3. Family /Caregiver/Community Supports: Lives in home with wife who has parkinsons. He now has a pancreatic mass presumed neoplastic but does not want work up. Has 2 daughters currently trading time in the home, and who are looking for paid care givers.  4. Cognitive / Functional decline: Interactive but poor historian. Falling frequently, having decline in function.  5. Follow up Palliative Care Visit: Palliative care will continue to follow for goals of care clarification and symptom management. Return 4 weeks or prn.  I spent 45 minutes providing this consultation,  from 1300 to 1345. More than 50% of the time in this consultation was spent coordinating communication.   HISTORY OF PRESENT ILLNESS:  Shane Lindsey is a 83 y.o. year old male with multiple medical problems including debility, pancreatic mass, anorexia, . Palliative Care was asked to follow this patient by consultation request of Shane Billet, MD to help address advance care planning and goals of care. This is the initial visit.  CODE STATUS: MOST left in home , TBD  PPS: 40% HOSPICE ELIGIBILITY/DIAGNOSIS: TBD  PAST MEDICAL HISTORY:  Past Medical History:  Diagnosis Date   Hyperlipemia    Hypertension    Pancreatic cyst     SOCIAL HX:  Social History   Tobacco Use   Smoking status: Never Smoker   Smokeless tobacco: Never Used  Substance Use Topics   Alcohol use:  Yes    Comment: Rum 2 oz a day    ALLERGIES: No Known Allergies   PERTINENT MEDICATIONS:  Outpatient Encounter Medications as of 10/06/2018  Medication Sig   atorvastatin (LIPITOR) 10 MG tablet Take 10 mg by mouth daily.   furosemide (LASIX) 20 MG tablet Take 20 mg by mouth daily.    Polyethyl Glycol-Propyl Glycol (SYSTANE OP) Place 1 drop into both eyes daily as needed (dry eyes).   tamsulosin (FLOMAX) 0.4 MG CAPS capsule Take 0.4 mg by mouth daily.   traMADol (ULTRAM) 50 MG tablet Take  1 tablet (50 mg total) by mouth every 6 (six) hours as needed for moderate pain.   No facility-administered encounter medications on Lindsey as of 10/06/2018.     PHYSICAL EXAM / ROS:   Current and past weights: 153.6 lbs  General: NAD, frail appearing, thin Cardiovascular: no chest pain reported, no edema which is a change. Has had great edema in the past. Pulmonary: no cough, no increased SOB Abdomen: appetite fair, 50%, denies, constipation, continent of bowel, nutrition supplements as needed GU: denies dysuria, continent of urine MSK:  no joint deformities, ambulatory with walker, fell off bed Friday, states falls getting up Skin: shear from fall on back, superficial. Neurological: Weakness, cognitive impairment, states good sleep  Shane File DNP AGPCNP-BC  COVID-19 PATIENT SCREENING TOOL  Person answering questions: _____________Carie______ _____   1.  Is the patient or any family member in the home showing any signs or symptoms regarding respiratory infection?               Person with Symptom- _________na_________________  a. Fever                                                                          Yes___ No___          ___________________  b. Shortness of breath                                                    Yes___ No___          ___________________ c. Cough/congestion                                       Yes___  No___         ___________________ d. Body aches/pains                                                         Yes___ No___        ____________________ e. Gastrointestinal symptoms (diarrhea, nausea)           Yes___ No___        ____________________  2. Within the past 14 days, has anyone living in the home had any contact with someone with or  under investigation for COVID-19?    Yes___ No__x   Person __________________

## 2018-10-09 ENCOUNTER — Ambulatory Visit: Payer: Medicare Other | Admitting: Cardiovascular Disease

## 2018-10-09 ENCOUNTER — Ambulatory Visit: Payer: Medicare Other | Admitting: Cardiology

## 2018-10-12 ENCOUNTER — Telehealth: Payer: Self-pay | Admitting: *Deleted

## 2018-10-12 NOTE — Telephone Encounter (Signed)
carie states that pt. Answer about the EUS is still no. She states that they have hired people to come in and stay with both pt and his wife that needs helps also. The pt. Is having a lot to deal with and he does not want to have procedure. Daughter states that she feels that if her Dad gets comfortable with the staff and things go well that he will then think about having EUS. I told her that she can call me anytime and I will put them on my list to call back in 2-3 weeks to see if he has changed his mind. She is good with this

## 2018-10-13 ENCOUNTER — Ambulatory Visit: Payer: Medicare Other | Admitting: Cardiology

## 2018-10-19 ENCOUNTER — Ambulatory Visit: Payer: Medicare Other | Admitting: Family

## 2018-10-23 ENCOUNTER — Inpatient Hospital Stay: Payer: Medicare Other | Admitting: Oncology

## 2018-10-26 ENCOUNTER — Ambulatory Visit: Payer: Medicare Other | Admitting: Oncology

## 2018-10-27 ENCOUNTER — Telehealth: Payer: Self-pay | Admitting: *Deleted

## 2018-10-27 ENCOUNTER — Ambulatory Visit: Payer: Medicare Other | Admitting: Cardiology

## 2018-10-27 NOTE — Telephone Encounter (Signed)
Called pt's daughter and spoke to her about pt and if he has decided to go ahead with EUS and poss. Bx. She states that he went to tate's office and his ca and potassium is dropping and dr. Hall Busing gave him OTC viactive and potassium OTC to try to get levels up. Potassium is low and he suggested going to ER to get fluids with K and Ca. But pt did not want to go. Dr. Hall Busing asked him if he was going to get the EUS and the pt. Told him that it is still on the table and he is thinking about it. He was worried about his wife and his daughter told him they have great care now and her dad does not have to worry. She will let me know if they change their mind and I will check back in 2 weeks. I did suggest her to ask tate for rx potassium since he is on lasix and she was worried about the pills because she knows it is big. I told her that it comes in liq, powder and the 10 meq is a smaller version. She will speak with Hall Busing on next visit. I also said that sometime people take pills with applesauce, ice cream, pudding. These are just possibilities and does not work for all people

## 2018-10-30 ENCOUNTER — Other Ambulatory Visit: Payer: Medicare Other | Admitting: Primary Care

## 2018-10-30 ENCOUNTER — Other Ambulatory Visit: Payer: Self-pay

## 2018-10-30 DIAGNOSIS — Z515 Encounter for palliative care: Secondary | ICD-10-CM

## 2018-10-30 NOTE — Progress Notes (Signed)
Shane Lindsey Consult Note Telephone: [phone]  Fax: [phone]   PATIENT NAME: Shane MATICH [address] [name] [address] [phone] (home)  DOB: April 30, 1935 MRN: [id number]  PRIMARY CARE PROVIDER:   Albina Billet, MD, [address]   [place] [address] [phone]  REFERRING PROVIDER:  Albina Billet, MD [address]   [place],  [place] [address] [phone]  RESPONSIBLE PARTY:   Extended Emergency Contact Information Primary Emergency Contact: Olufemi, Mofield Address: [address] [place], [place] [address] Johnnette Litter of [place] Phone: [phone] Relation: Spouse Secondary Emergency Contact: Jaydrian, Corpening          [place], [place] [address] Johnnette Litter of [employer] Phone: [phone] Relation: Daughter   ASSESSMENT AND RECOMMENDATIONS:   1. Advance Care Planning/Goals of Care: Goals include to maximize quality of life and symptom management. I visited Shane Lindsey today at his home. His daughter stated that he has had some recent vomiting and his anorexia is increasing.  Family also feels he may be having periods of less mental clarity. Patient has decided not to pursue any diagnosis or treatment of his condition. He has cysts on his pancreas and a mass on his prostate, but has not had any pathology.  2. Symptom Management:   Pain: He says he has some pain in his legs. His family feels he is not reporting pain accurately. They feel he may be having some denial about his symptoms. He is taking oxycodone several times a day which seems to be relieving his pain but they also endorse his great  irritability, not in character. I would recommend scheduling pain medicine every six hours to see if this addresses his signs and symptoms of pain. We also discussed his use of alcohol and that he should not mix this with narcotics. Daughter states she is careful that he does not.  Ascites: He  seems to have had an increase of ascites in the last few days. Mild ascites was noted in the record in early Sept. I provided education as to the meaning of it regarding advancing G.I. disease. He does not have any shortness of breath currently but is experiencing anorexia and early satiety. Denies constipation and endorses steatorrhea.  Steatorrhea: Patient's daughter reports clay colored stools. He has not been on any pancreatic supplements to this point. We discussed Creon, but this may no longer be a goal as family has decided to initiate hospice services.   Disease progression. We discussed the progress of this disease. The patient has refused any additional diagnostics. His symptoms began in February or March according to his daughter and he presented to ED  in July with weight loss, 25 lbs. We discussed the typical course life expectancy of untreated pancreatic cancer as being averaged as eight to 10 months if that is indeed the process. Daughter and wife showed appropriate response to this information and within this discussion decided to go ahead and change to hospice services. [facility]'s currently having [facility] but have asked me to initiate a hospice referral.   We discuss the MOST form with daughter and wife but when we went to include the patient in the discussion he had fallen asleep. We went over the questions for his wife and now his daughter feels capable to discuss these care decisions with him when he awakens. Patient is currently  living at home with his wife of over 60 years. They have a new live-in caregiver that his daughter employed and she is working out well. His three adult children are also involved with the care of him and his wife who has Parkinson's disease. Is functionality is declining due to disease process  of presumed pancreatic cancer.  3. Family /Caregiver/Community Supports: Patient is a retired Education officer, environmental and has many church friends but has wanted  relative seclusion with his family. His family identifies needing outside support, which hospice can provide and they are  very open to hospice services. We discussed intent for treatment. His daughter states that oncology is calling every couple weeks to see if he wants to pursue diagnostics and treatment. Due to his decline in the last four weeks I told her I felt that any treatment window was likely closing. She states he is adamant he does not want to pursue any treatments and that he is alert and oriented and able to make this decision. For that reason I suggested it was time to call in hospice who would support him with management of  symptoms and the family in providing his care.  4. Cognitive / Functional decline: Decline due to disease process as above.  5. Follow up Palliative Care Visit: Refer to hospice per family request and appropriateness of prognosis.  I spent 60 minutes providing this consultation,  from 1600 to 1700. More than 50% of the time in this consultation was spent coordinating communication.   HISTORY OF PRESENT ILLNESS:  Shane Lindsey is a 83 y.o. year old male with multiple medical problems including presumed pancreatic ca, pancreatic cysts, weight loss, ascites. Palliative Care was asked to follow this patient by consultation request of Jaclyn Shaggy, MD to help address advance care planning and goals of care. This is a follow up visit.  CODE STATUS: TBD  PPS: 30% HOSPICE ELIGIBILITY/DIAGNOSIS: yes/weight loss, pancreatic cysts  PAST MEDICAL HISTORY:  Past Medical History:  Diagnosis Date  . Hyperlipemia   . Hypertension   . Pancreatic cyst     SOCIAL HX:  Social History   Tobacco Use  . Smoking status: Never Smoker  . Smokeless tobacco: Never Used  Substance Use Topics  . Alcohol use: Yes    Comment: Rum 2 oz a day    ALLERGIES: No Known Allergies   PERTINENT MEDICATIONS:  Outpatient Encounter Medications as of 10/30/2018  Medication Sig  .  atorvastatin (LIPITOR) 10 MG tablet Take 10 mg by mouth daily.  . furosemide (LASIX) 20 MG tablet Take 20 mg by mouth daily.   Bertram Gala Glycol-Propyl Glycol (SYSTANE OP) Place 1 drop into both eyes daily as needed (dry eyes).  . tamsulosin (FLOMAX) 0.4 MG CAPS capsule Take 0.4 mg by mouth daily.  . traMADol (ULTRAM) 50 MG tablet Take 1 tablet (50 mg total) by mouth every 6 (six) hours as needed for moderate pain.   No facility-administered encounter medications on file as of 10/30/2018.     PHYSICAL EXAM / ROS:   Current and past weights: 162 lbs,  Baseline weight 187 lb in 2017. General: NAD, frail appearing, thin, color of skin is sallow, some yellow discoloration of sclera, labs pending Cardiovascular: no chest pain reported, no edema,  Pulmonary: no cough, no increased SOB Abdomen: appetite fair, denies constipation, family describes steatorrhea continent of bowel, taking nutritional supplements GU: denies dysuria, continent of urine, prostate mass on MRI in July, not followed up. MSK:  no  joint deformities, ambulatory in home, fell 4 days ago and has bruise on right temporal area, uses walker for outside, fell x 1 today.  Skin: no rashes or wounds reported, Right temporal bruise from fall x 4 days. Neurological: Weakness, forgetful, questionable historian Eliezer LoftsKathryn McKelvey Rayona Sardinha, NP  COVID-19 PATIENT SCREENING TOOL  Person answering questions: ______________daughter_____ _____   1.  Is the patient or any family member in the home showing any signs or symptoms regarding respiratory infection?               Person with Symptom- _________na__________________  a. Fever                                                                          Yes___ No___          ___________________  b. Shortness of breath                                                    Yes___ No___          ___________________ c. Cough/congestion                                       Yes___  No___          ___________________ d. Body aches/pains                                                         Yes___ No___        ____________________ e. Gastrointestinal symptoms (diarrhea, nausea)           Yes___ No___        ____________________  2. Within the past 14 days, has anyone living in the home had any contact with someone with or under investigation for COVID-19?    Yes___ No_x_   Person __________________

## 2018-10-31 ENCOUNTER — Telehealth: Payer: Self-pay | Admitting: Primary Care

## 2018-10-31 NOTE — Telephone Encounter (Signed)
T/c to MD office to request hospice referral. They approved and asked for orders to be faxed. Referral intake notified.

## 2018-11-06 ENCOUNTER — Telehealth: Payer: Self-pay | Admitting: Primary Care

## 2018-11-06 NOTE — Telephone Encounter (Signed)
Patient was admitted to hospice on 11-21-18 and died on 24-Nov-2018.

## 2018-11-12 DEATH — deceased

## 2018-11-13 ENCOUNTER — Telehealth: Payer: Self-pay | Admitting: *Deleted

## 2018-11-13 NOTE — Telephone Encounter (Signed)
Daughter had wanted me to call her every 2 weeks to see if her Dad has changed his mind on having a EUS with poss. Bx. I have left my name and number and brief message to see if he has changed his mind

## 2019-12-09 IMAGING — DX DG CHEST 1V PORT
1 series · 1 of 1 positions shown · non-contrast
Comparison: 08/10/2018

CLINICAL DATA: Shortness of breath, edema

EXAM:
PORTABLE CHEST 1 VIEW

[chest ap]
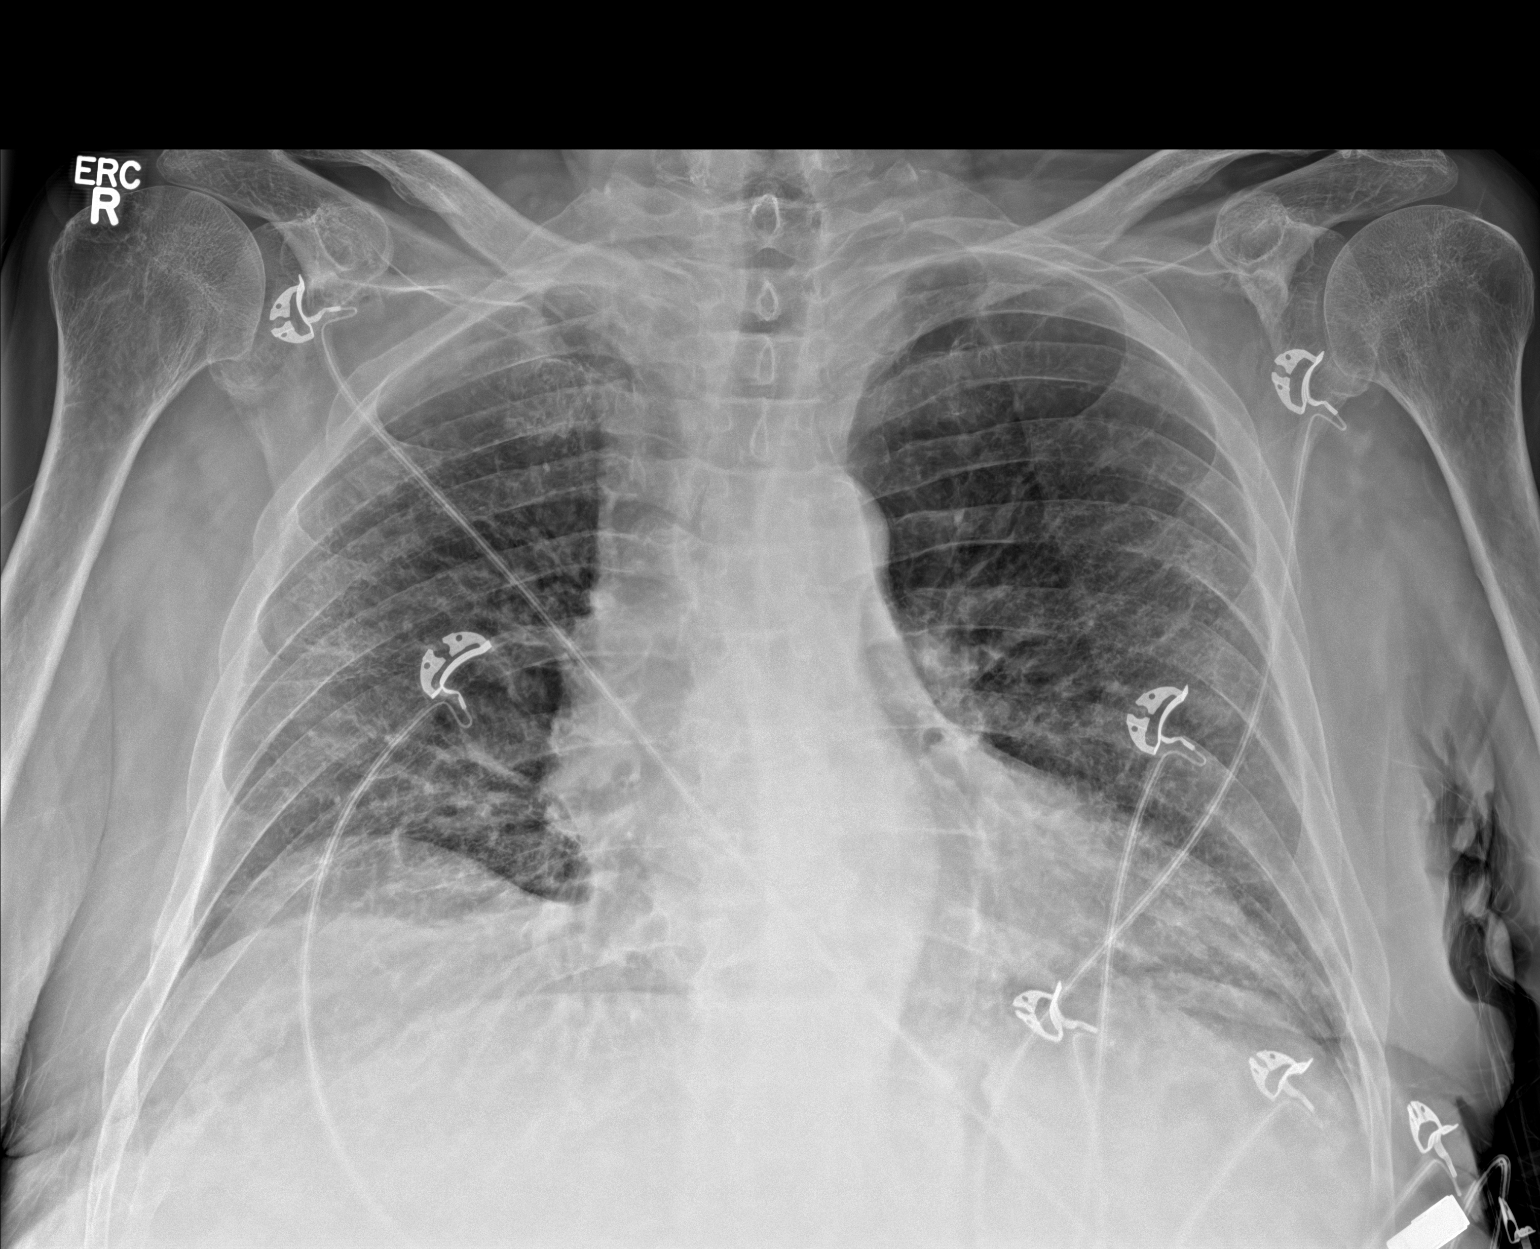

[1 of 1 positions shown; findings below may reference images not displayed]

FINDINGS: Cardiomegaly. Coarsened interstitial prominence throughout the lungs
is similar prior study, likely chronic interstitial lung disease.
Low volumes. No confluent opacities, effusions or overt edema. No
acute bony abnormality.
IMPRESSION: Mild cardiomegaly. Chronic interstitial prominence likely reflects
chronic interstitial lung disease.
